# Patient Record
Sex: Male | Born: 1985 | Race: Black or African American | Hispanic: No | Marital: Single | Smoking: Never smoker
Health system: Southern US, Community
[De-identification: ages and names within clinical notes are randomized; demographics above are authoritative.]

---

## 2013-10-04 HISTORY — PX: VASECTOMY: SHX75

## 2020-10-30 DIAGNOSIS — U071 COVID-19: Secondary | ICD-10-CM | POA: Diagnosis not present

## 2020-10-30 DIAGNOSIS — B342 Coronavirus infection, unspecified: Secondary | ICD-10-CM

## 2020-10-30 HISTORY — DX: Coronavirus infection, unspecified: B34.2

## 2020-11-02 ENCOUNTER — Emergency Department (HOSPITAL_COMMUNITY)
Admission: EM | Admit: 2020-11-02 | Discharge: 2020-11-02 | Disposition: A | Discharge status: Home / Self Care | Payer: BC Managed Care – PPO | Attending: Emergency Medicine | Admitting: Emergency Medicine

## 2020-11-02 ENCOUNTER — Encounter (HOSPITAL_COMMUNITY): Payer: Self-pay | Admitting: Emergency Medicine

## 2020-11-02 ENCOUNTER — Other Ambulatory Visit: Payer: Self-pay

## 2020-11-02 ENCOUNTER — Emergency Department (HOSPITAL_COMMUNITY): Payer: BC Managed Care – PPO

## 2020-11-02 DIAGNOSIS — R509 Fever, unspecified: Secondary | ICD-10-CM | POA: Diagnosis not present

## 2020-11-02 DIAGNOSIS — R0602 Shortness of breath: Secondary | ICD-10-CM | POA: Insufficient documentation

## 2020-11-02 DIAGNOSIS — R531 Weakness: Secondary | ICD-10-CM | POA: Diagnosis not present

## 2020-11-02 DIAGNOSIS — R059 Cough, unspecified: Secondary | ICD-10-CM | POA: Insufficient documentation

## 2020-11-02 DIAGNOSIS — Z5321 Procedure and treatment not carried out due to patient leaving prior to being seen by health care provider: Secondary | ICD-10-CM | POA: Diagnosis not present

## 2020-11-02 DIAGNOSIS — U071 COVID-19: Secondary | ICD-10-CM | POA: Insufficient documentation

## 2020-11-02 DIAGNOSIS — M791 Myalgia, unspecified site: Secondary | ICD-10-CM | POA: Diagnosis not present

## 2020-11-02 LAB — BASIC METABOLIC PANEL
Anion gap: 13 (ref 5–15)
BUN: 10 mg/dL (ref 6–20)
CO2: 23 mmol/L (ref 22–32)
Calcium: 9 mg/dL (ref 8.9–10.3)
Chloride: 97 mmol/L — ABNORMAL LOW (ref 98–111)
Creatinine, Ser: 1.06 mg/dL (ref 0.61–1.24)
GFR, Estimated: >60 mL/min (ref >60)
Glucose, Bld: 139 mg/dL — ABNORMAL HIGH (ref 70–99)
Potassium: 4.1 mmol/L (ref 3.5–5.1)
Sodium: 133 mmol/L — ABNORMAL LOW (ref 135–145)

## 2020-11-02 LAB — CBC
HCT: 49.5 % (ref 39.0–52.0)
Hemoglobin: 15.5 g/dL (ref 13.0–17.0)
MCH: 27.6 pg (ref 26.0–34.0)
MCHC: 31.3 g/dL (ref 30.0–36.0)
MCV: 88.1 fL (ref 80.0–100.0)
Platelets: 233 10*3/uL (ref 150–400)
RBC: 5.62 MIL/uL (ref 4.22–5.81)
RDW: 13.9 % (ref 11.5–15.5)
WBC: 5.9 10*3/uL (ref 4.0–10.5)
nRBC: 0 % (ref 0.0–0.2)

## 2020-11-02 NOTE — ED Triage Notes (Signed)
COVID + 1/27.  C/o fever, chills, cough, body aches, generalized weakness, and SOB.

## 2020-11-02 NOTE — ED Notes (Signed)
Pt called for vitals x2 no response.  °

## 2020-11-02 NOTE — ED Notes (Signed)
Patient called x3 with no response °

## 2020-11-07 ENCOUNTER — Encounter (HOSPITAL_COMMUNITY): Payer: Self-pay

## 2020-11-07 ENCOUNTER — Inpatient Hospital Stay (HOSPITAL_COMMUNITY)
Admission: EM | Admit: 2020-11-07 | Discharge: 2020-11-11 | DRG: 871 | Disposition: A | Discharge status: Home / Self Care | Payer: BC Managed Care – PPO | Attending: Internal Medicine | Admitting: Internal Medicine

## 2020-11-07 ENCOUNTER — Emergency Department (HOSPITAL_COMMUNITY): Payer: BC Managed Care – PPO

## 2020-11-07 ENCOUNTER — Inpatient Hospital Stay (HOSPITAL_COMMUNITY): Payer: BC Managed Care – PPO

## 2020-11-07 DIAGNOSIS — R000 Tachycardia, unspecified: Secondary | ICD-10-CM | POA: Diagnosis not present

## 2020-11-07 DIAGNOSIS — J189 Pneumonia, unspecified organism: Secondary | ICD-10-CM | POA: Diagnosis not present

## 2020-11-07 DIAGNOSIS — E876 Hypokalemia: Secondary | ICD-10-CM | POA: Diagnosis not present

## 2020-11-07 DIAGNOSIS — R0602 Shortness of breath: Secondary | ICD-10-CM | POA: Diagnosis not present

## 2020-11-07 DIAGNOSIS — Z880 Allergy status to penicillin: Secondary | ICD-10-CM | POA: Diagnosis not present

## 2020-11-07 DIAGNOSIS — R918 Other nonspecific abnormal finding of lung field: Secondary | ICD-10-CM | POA: Diagnosis not present

## 2020-11-07 DIAGNOSIS — R0902 Hypoxemia: Secondary | ICD-10-CM | POA: Diagnosis not present

## 2020-11-07 DIAGNOSIS — J1282 Pneumonia due to coronavirus disease 2019: Secondary | ICD-10-CM | POA: Diagnosis not present

## 2020-11-07 DIAGNOSIS — J9601 Acute respiratory failure with hypoxia: Secondary | ICD-10-CM | POA: Diagnosis present

## 2020-11-07 DIAGNOSIS — K760 Fatty (change of) liver, not elsewhere classified: Secondary | ICD-10-CM | POA: Diagnosis not present

## 2020-11-07 DIAGNOSIS — A4189 Other specified sepsis: Secondary | ICD-10-CM | POA: Diagnosis not present

## 2020-11-07 DIAGNOSIS — U071 COVID-19: Secondary | ICD-10-CM | POA: Diagnosis not present

## 2020-11-07 DIAGNOSIS — R7989 Other specified abnormal findings of blood chemistry: Secondary | ICD-10-CM | POA: Diagnosis not present

## 2020-11-07 DIAGNOSIS — E871 Hypo-osmolality and hyponatremia: Secondary | ICD-10-CM | POA: Diagnosis not present

## 2020-11-07 DIAGNOSIS — Z6841 Body Mass Index (BMI) 40.0 and over, adult: Secondary | ICD-10-CM

## 2020-11-07 DIAGNOSIS — I10 Essential (primary) hypertension: Secondary | ICD-10-CM | POA: Diagnosis not present

## 2020-11-07 LAB — COMPREHENSIVE METABOLIC PANEL
ALT: 57 U/L — ABNORMAL HIGH (ref 0–44)
AST: 71 U/L — ABNORMAL HIGH (ref 15–41)
Albumin: 4.3 g/dL (ref 3.5–5.0)
Alkaline Phosphatase: 36 U/L — ABNORMAL LOW (ref 38–126)
Anion gap: 15 (ref 5–15)
BUN: 17 mg/dL (ref 6–20)
CO2: 22 mmol/L (ref 22–32)
Calcium: 9.3 mg/dL (ref 8.9–10.3)
Chloride: 95 mmol/L — ABNORMAL LOW (ref 98–111)
Creatinine, Ser: 1.18 mg/dL (ref 0.61–1.24)
GFR, Estimated: >60 mL/min (ref >60)
Glucose, Bld: 123 mg/dL — ABNORMAL HIGH (ref 70–99)
Potassium: 4.4 mmol/L (ref 3.5–5.1)
Sodium: 132 mmol/L — ABNORMAL LOW (ref 135–145)
Total Bilirubin: 1.2 mg/dL (ref 0.3–1.2)
Total Protein: 9.2 g/dL — ABNORMAL HIGH (ref 6.5–8.1)

## 2020-11-07 LAB — CBC WITH DIFFERENTIAL/PLATELET
Abs Immature Granulocytes: 0.02 10*3/uL (ref 0.00–0.07)
Basophils Absolute: 0 10*3/uL (ref 0.0–0.1)
Basophils Relative: 0 %
Eosinophils Absolute: 0 10*3/uL (ref 0.0–0.5)
Eosinophils Relative: 0 %
HCT: 45.7 % (ref 39.0–52.0)
Hemoglobin: 14.9 g/dL (ref 13.0–17.0)
Immature Granulocytes: 0 %
Lymphocytes Relative: 18 %
Lymphs Abs: 1.3 10*3/uL (ref 0.7–4.0)
MCH: 28.2 pg (ref 26.0–34.0)
MCHC: 32.6 g/dL (ref 30.0–36.0)
MCV: 86.4 fL (ref 80.0–100.0)
Monocytes Absolute: 0.4 10*3/uL (ref 0.1–1.0)
Monocytes Relative: 5 %
Neutro Abs: 5.7 10*3/uL (ref 1.7–7.7)
Neutrophils Relative %: 77 %
Platelets: 360 10*3/uL (ref 150–400)
RBC: 5.29 MIL/uL (ref 4.22–5.81)
RDW: 14 % (ref 11.5–15.5)
WBC: 7.4 10*3/uL (ref 4.0–10.5)
nRBC: 0 % (ref 0.0–0.2)

## 2020-11-07 LAB — HEPATITIS PANEL, ACUTE
HCV Ab: NONREACTIVE
Hep A IgM: NONREACTIVE
Hep B C IgM: NONREACTIVE
Hepatitis B Surface Ag: NONREACTIVE

## 2020-11-07 LAB — D-DIMER, QUANTITATIVE: D-Dimer, Quant: 1.98 ug/mL-FEU — ABNORMAL HIGH (ref 0.00–0.50)

## 2020-11-07 LAB — TRIGLYCERIDES: Triglycerides: 136 mg/dL (ref <150)

## 2020-11-07 LAB — LACTIC ACID, PLASMA
Lactic Acid, Venous: 1.2 mmol/L (ref 0.5–1.9)
Lactic Acid, Venous: 1.1 mmol/L (ref 0.5–1.9)

## 2020-11-07 LAB — C-REACTIVE PROTEIN: CRP: 18.2 mg/dL — ABNORMAL HIGH (ref <1.0)

## 2020-11-07 LAB — FERRITIN: Ferritin: 583 ng/mL — ABNORMAL HIGH (ref 24–336)

## 2020-11-07 LAB — FIBRINOGEN: Fibrinogen: >800 mg/dL — ABNORMAL HIGH (ref 210–475)

## 2020-11-07 LAB — PROCALCITONIN: Procalcitonin: <0.1 ng/mL

## 2020-11-07 LAB — LACTATE DEHYDROGENASE: LDH: 413 U/L — ABNORMAL HIGH (ref 98–192)

## 2020-11-07 MED ORDER — METHYLPREDNISOLONE SODIUM SUCC 125 MG IJ SOLR
125.0000 mg | Freq: Once | INTRAMUSCULAR | Status: AC
  Administered 2020-11-07: 125 mg via INTRAVENOUS
  Filled 2020-11-07: qty 2

## 2020-11-07 MED ORDER — GUAIFENESIN-DM 100-10 MG/5ML PO SYRP
10.0000 mL | ORAL_SOLUTION | ORAL | Status: DC | PRN
  Administered 2020-11-10: 10 mL via ORAL
  Filled 2020-11-07: qty 10

## 2020-11-07 MED ORDER — ASCORBIC ACID 500 MG PO TABS
500.0000 mg | ORAL_TABLET | Freq: Every day | ORAL | Status: DC
  Administered 2020-11-08 – 2020-11-11 (×4): 500 mg via ORAL
  Filled 2020-11-07 (×4): qty 1

## 2020-11-07 MED ORDER — BARICITINIB 2 MG PO TABS
4.0000 mg | ORAL_TABLET | Freq: Every day | ORAL | Status: DC
  Administered 2020-11-08 – 2020-11-11 (×4): 4 mg via ORAL
  Filled 2020-11-07 (×4): qty 2

## 2020-11-07 MED ORDER — SODIUM CHLORIDE 0.9 % IV SOLN: INTRAVENOUS | Status: DC

## 2020-11-07 MED ORDER — SODIUM CHLORIDE 0.9 % IV SOLN
1.0000 mg/kg | Freq: Two times a day (BID) | INTRAVENOUS | Status: DC
  Administered 2020-11-08 – 2020-11-09 (×4): 145 mg via INTRAVENOUS
  Filled 2020-11-07: qty 1.2
  Filled 2020-11-07 (×2): qty 2.32
  Filled 2020-11-07: qty 1.2
  Filled 2020-11-07: qty 1.16
  Filled 2020-11-07 (×2): qty 2.32

## 2020-11-07 MED ORDER — SODIUM CHLORIDE 0.9 % IV SOLN
200.0000 mg | Freq: Once | INTRAVENOUS | Status: AC
  Administered 2020-11-07: 200 mg via INTRAVENOUS
  Filled 2020-11-07: qty 200

## 2020-11-07 MED ORDER — ACETAMINOPHEN 325 MG PO TABS
650.0000 mg | ORAL_TABLET | Freq: Once | ORAL | Status: DC | PRN
  Filled 2020-11-07: qty 2

## 2020-11-07 MED ORDER — ENOXAPARIN SODIUM 80 MG/0.8ML ~~LOC~~ SOLN
70.0000 mg | SUBCUTANEOUS | Status: DC
  Administered 2020-11-08 – 2020-11-10 (×3): 70 mg via SUBCUTANEOUS
  Filled 2020-11-07 (×3): qty 0.8

## 2020-11-07 MED ORDER — IOHEXOL 350 MG/ML SOLN
100.0000 mL | Freq: Once | INTRAVENOUS | Status: AC | PRN
  Administered 2020-11-07: 100 mL via INTRAVENOUS

## 2020-11-07 MED ORDER — HYDROCOD POLST-CPM POLST ER 10-8 MG/5ML PO SUER: 5.0000 mL | Freq: Two times a day (BID) | ORAL | Status: DC | PRN

## 2020-11-07 MED ORDER — SODIUM CHLORIDE 0.9 % IV SOLN
100.0000 mg | Freq: Every day | INTRAVENOUS | Status: AC
  Administered 2020-11-08 – 2020-11-11 (×4): 100 mg via INTRAVENOUS
  Filled 2020-11-07 (×4): qty 20

## 2020-11-07 MED ORDER — IPRATROPIUM-ALBUTEROL 20-100 MCG/ACT IN AERS
1.0000 | INHALATION_SPRAY | Freq: Four times a day (QID) | RESPIRATORY_TRACT | Status: DC
  Administered 2020-11-08 – 2020-11-10 (×12): 1 via RESPIRATORY_TRACT
  Filled 2020-11-07: qty 4

## 2020-11-07 MED ORDER — ACETAMINOPHEN 325 MG PO TABS: 650.0000 mg | ORAL_TABLET | Freq: Four times a day (QID) | ORAL | Status: DC | PRN

## 2020-11-07 MED ORDER — PREDNISONE 20 MG PO TABS: 50.0000 mg | ORAL_TABLET | Freq: Every day | ORAL | Status: DC

## 2020-11-07 MED ORDER — ZINC SULFATE 220 (50 ZN) MG PO CAPS
220.0000 mg | ORAL_CAPSULE | Freq: Every day | ORAL | Status: DC
  Administered 2020-11-08 – 2020-11-11 (×4): 220 mg via ORAL
  Filled 2020-11-07 (×4): qty 1

## 2020-11-07 NOTE — ED Notes (Signed)
Tried to call report to [employer], she is unavailable at this time, states that she will call back when she is  available.

## 2020-11-07 NOTE — ED Provider Notes (Signed)
[facility]-EMERGENCY DEPT Provider Note   CSN: [id number] Arrival date & time: 11/07/20  1023     History Chief Complaint  Patient presents with  . Covid Positive  . Fever    Kevin Brock is a 35 y.o. male.  He tested positive for Covid on January 27.  Has felt sicker since then.  Cough, fever, chills, headache, body aches, vomiting, diarrhea, shortness of breath.  Poor p.o. intake.  Has not been vaccinated.  Has received no other therapy for this other than isolation.  Non-smoker.  Oxygen saturation was 84% on room air on arrival to triage.  Placed on 4 L nasal cannula  The history is provided by the patient.  Fever Max temp prior to arrival:  103 Temp source:  Oral Severity:  Moderate Timing:  Intermittent Progression:  Unchanged Chronicity:  New Relieved by:  None tried Worsened by:  Nothing Ineffective treatments:  Acetaminophen Associated symptoms: chills, congestion, cough, diarrhea, headaches, myalgias, nausea, sore throat and vomiting   Associated symptoms: no chest pain, no dysuria and no rash   Shortness of Breath Severity:  Moderate Onset quality:  Gradual Timing:  Intermittent Progression:  Worsening Chronicity:  New Relieved by:  Nothing Worsened by:  Activity and coughing Ineffective treatments:  Rest Associated symptoms: cough, fever, headaches, sore throat and vomiting   Associated symptoms: no chest pain and no rash        History reviewed. No pertinent past medical history.  There are no problems to display for this patient.   History reviewed. No pertinent surgical history.     History reviewed. No pertinent family history.  Social History   Tobacco Use  . Smoking status: Never Smoker  . Smokeless tobacco: Never Used  Substance Use Topics  . Alcohol use: Not Currently  . Drug use: Not Currently    Home Medications Prior to Admission medications   Not on File    Allergies    Penicillins  Review of  Systems   Review of Systems  Constitutional: Positive for appetite change, chills, fatigue and fever.  HENT: Positive for congestion and sore throat.   Eyes: Negative for visual disturbance.  Respiratory: Positive for cough and shortness of breath.   Cardiovascular: Negative for chest pain.  Gastrointestinal: Positive for diarrhea, nausea and vomiting.  Genitourinary: Negative for dysuria.  Musculoskeletal: Positive for myalgias.  Skin: Negative for rash.  Neurological: Positive for headaches.    Physical Exam Updated Vital Signs BP (!) 142/91 (BP Location: Left Arm)   Pulse (!) 120   Temp (!) 103 F (39.4 C) (Oral)   Resp 20   SpO2 95%   Physical Exam Vitals and nursing note reviewed.  Constitutional:      Appearance: Normal appearance. He is well-developed and well-nourished. He is ill-appearing.  HENT:     Head: Normocephalic and atraumatic.     Mouth/Throat:     Mouth: Mucous membranes are moist.     Pharynx: Oropharynx is clear.  Eyes:     Conjunctiva/sclera: Conjunctivae normal.  Cardiovascular:     Rate and Rhythm: Regular rhythm. Tachycardia present.     Heart sounds: No murmur heard.   Pulmonary:     Effort: Accessory muscle usage present. No respiratory distress.     Breath sounds: Rhonchi (scattered) present.  Abdominal:     Palpations: Abdomen is soft.     Tenderness: There is no abdominal tenderness. There is no guarding or rebound.  Musculoskeletal:  General: No swelling, deformity, signs of injury or edema. Normal range of motion.     Cervical back: Neck supple.  Skin:    General: Skin is warm and dry.     Capillary Refill: Capillary refill takes less than 2 seconds.  Neurological:     General: No focal deficit present.     Mental Status: He is alert.  Psychiatric:        Mood and Affect: Mood and affect normal.     ED Results / Procedures / Treatments   Labs (all labs ordered are listed, but only abnormal results are displayed) Labs  Reviewed  D-DIMER, QUANTITATIVE (NOT AT [facility]) - Abnormal; Notable for the following components:      Result Value   D-Dimer, Quant 1.98 (*)    All other components within normal limits  FERRITIN - Abnormal; Notable for the following components:   Ferritin 583 (*)    All other components within normal limits  FIBRINOGEN - Abnormal; Notable for the following components:   Fibrinogen >800 (*)    All other components within normal limits  C-REACTIVE PROTEIN - Abnormal; Notable for the following components:   CRP 18.2 (*)    All other components within normal limits  COMPREHENSIVE METABOLIC PANEL - Abnormal; Notable for the following components:   Sodium 132 (*)    Chloride 95 (*)    Glucose, Bld 123 (*)    Total Protein 9.2 (*)    AST 71 (*)    ALT 57 (*)    Alkaline Phosphatase 36 (*)    All other components within normal limits  LACTATE DEHYDROGENASE - Abnormal; Notable for the following components:   LDH 413 (*)    All other components within normal limits  C-REACTIVE PROTEIN - Abnormal; Notable for the following components:   CRP 16.0 (*)    All other components within normal limits  D-DIMER, QUANTITATIVE (NOT AT [facility]) - Abnormal; Notable for the following components:   D-Dimer, Quant 1.77 (*)    All other components within normal limits  FERRITIN - Abnormal; Notable for the following components:   Ferritin 600 (*)    All other components within normal limits  MAGNESIUM - Abnormal; Notable for the following components:   Magnesium 2.8 (*)    All other components within normal limits  CULTURE, BLOOD (ROUTINE X 2)  CULTURE, BLOOD (ROUTINE X 2)  LACTIC ACID, PLASMA  LACTIC ACID, PLASMA  CBC WITH DIFFERENTIAL/PLATELET  TRIGLYCERIDES  PROCALCITONIN  HEPATITIS PANEL, ACUTE  CBC WITH DIFFERENTIAL/PLATELET  PHOSPHORUS  HIV ANTIBODY (ROUTINE TESTING W REFLEX)    EKG EKG Interpretation  Date/Time:  Friday November 07 2020 13:11:52 EST Ventricular Rate:  111 PR  Interval:    QRS Duration: 105 QT Interval:  317 QTC Calculation: 431 R Axis:   36 Text Interpretation: Sinus tachycardia Borderline T abnormalities, inferior leads No significant change since prior 1/22 Confirmed by Meridee Score [id number]) on 11/07/2020 1:40:48 PM   Radiology DG Chest Port 1 View  Result Date: 11/07/2020 CLINICAL DATA:  Clip EXAM: PORTABLE CHEST 1 VIEW COMPARISON:  November 02, 2020 FINDINGS: The cardiomediastinal silhouette is unchanged in contour.Low lung volumes. No pleural effusion. No pneumothorax. Increased multifocal bilateral peripheral predominant heterogeneous opacities, consistent with the sequela of COVID-19 infection. Visualized abdomen is unremarkable. No acute osseous abnormality. IMPRESSION: Increased multifocal bilateral peripheral predominant heterogeneous opacities, consistent with the sequela of COVID-19 infection. Electronically Signed   By: Meda Klinefelter MD   On: 11/07/2020 11:37  Procedures .Critical Care Performed by: Terrilee Files, MD Authorized by: Terrilee Files, MD   Critical care provider statement:    Critical care time (minutes):  45   Critical care time was exclusive of:  Separately billable procedures and treating other patients   Critical care was necessary to treat or prevent imminent or life-threatening deterioration of the following conditions:  Respiratory failure   Critical care was time spent personally by me on the following activities:  Discussions with consultants, evaluation of patient's response to treatment, examination of patient, ordering and performing treatments and interventions, ordering and review of laboratory studies, ordering and review of radiographic studies, pulse oximetry, re-evaluation of patient's condition, obtaining history from patient or surrogate and review of old charts     Medications Ordered in ED Medications  acetaminophen (TYLENOL) tablet 650 mg (has no administration in time range)   remdesivir 200 mg in sodium chloride 0.9% 250 mL IVPB (has no administration in time range)  remdesivir 100 mg in sodium chloride 0.9 % 100 mL IVPB (has no administration in time range)  methylPREDNISolone sodium succinate (SOLU-MEDROL) 125 mg/2 mL injection 125 mg (125 mg Intravenous Given 11/07/20 1235)    ED Course  I have reviewed the triage vital signs and the nursing notes.  Pertinent labs & imaging results that were available during my care of the patient were reviewed by me and considered in my medical decision making (see chart for details).  Clinical Course as of 11/07/20 1357  Fri Nov 07, 2020  1317 Discussed with [facility] hospitalist Dr. Ronaldo Miyamoto who will evaluate the patient for admission. [MB]    Clinical Course User Index [MB] Terrilee Files, MD   MDM Rules/Calculators/A&P                         Kevin Brock was evaluated in Emergency Department on 11/07/2020 for the symptoms described in the history of present illness. He was evaluated in the context of the global COVID-19 pandemic, which necessitated consideration that the patient might be at risk for infection with the SARS-CoV-2 virus that causes COVID-19. Institutional protocols and algorithms that pertain to the evaluation of patients at risk for COVID-19 are in a state of rapid change based on information released by regulatory bodies including the CDC and federal and state organizations. These policies and algorithms were followed during the patient's care in the ED.  This patient complains of worsening Covid symptoms including fever shortness of breath; this involves an extensive number of treatment Options and is a complaint that carries with it a high risk of complications and Morbidity. The differential includes COVID, COVID pneumonia, sepsis, Sirs, bacterial coinfection  I ordered, reviewed and interpreted labs, which included CBC with normal white count normal hemoglobin, chemistries and LFTs fairly normal other  than some mild bump in AST and ALT likely due to Covid I ordered medication IV fluids and steroids, IV remdesivir I ordered imaging studies which included chest x-ray and I independently    visualized and interpreted imaging which showed multifocal pneumonia Previous records obtained and reviewed in epic including recent ED visit.  Patient had a positive Covid test from [facility] that is in Care Everywhere I consulted Dr. Ronaldo Miyamoto [facility] hospitalist and discussed lab and imaging findings  Critical Interventions: Work-up and management of patient's acute hypoxia and COVID pneumonia  After the interventions stated above, I reevaluated the patient and found patient to be hemodynamically stable although still tachypneic with  increased work of breathing.  Requiring oxygen.  Will need admission to the hospital for further stabilization.  Patient in agreement with plan.   Final Clinical Impression(s) / ED Diagnoses Final diagnoses:  Acute hypoxemic respiratory failure due to COVID-19 [facility])  Multifocal pneumonia  Hypokalemia    Rx / DC Orders ED Discharge Orders    None       Terrilee Files, MD 11/08/20 1131

## 2020-11-07 NOTE — ED Notes (Signed)
PT GIVEN APPLE JUICE AND APPLESAUCE.

## 2020-11-07 NOTE — ED Triage Notes (Addendum)
Pt presents with c/o Covid positive on 1/27. Pt reports cough and body aches. Pt is on 4L of O2 via EMS, was 84 % on RA upon their arrival.

## 2020-11-07 NOTE — H&P (Signed)
History and Physical    Kevin Brock LOV:[id number] DOB: 1986-09-26 DOA: 11/07/2020  PCP: Patient, No Pcp Per  Patient coming from: Home  Chief Complaint: dyspnea  HPI: Kevin Brock is a 35 y.o. male with noe significant medical history. Presenting with fevers, chills and dyspnea for past 2 weeks. He initially tried APAP, robitussin, and nyquil to help. However he received no relief. After dealing with his symptoms for several days, he went to urgent care. He was told that he was positive for COVID and that he should self isolate. So he went home to quarantine. However, he symptoms only worsen. He decided that he would come to the ED. He denies any other alleviating or aggravating factors. He denies any other treatments.   ED Course: He was found to be hypoxic. Started on remdes and steroids. [facility] was called for admission.   Review of Systems:  Denies CP, palpitations, N/V/D. Reports subjective fevers. Reports chills. Denies lightheadedness, dizziness, syncopal episodes. He is unvaccinated.  Review of systems is otherwise negative for all not mentioned in HPI.   PMHx History reviewed. No pertinent past medical history.  PSHx History reviewed. No pertinent surgical history.  SocHx  reports that he has never smoked. He has never used smokeless tobacco. He reports previous alcohol use. He reports previous drug use.  Allergies  Allergen Reactions  . Penicillins     FamHx History reviewed. No pertinent family history.  Prior to Admission medications   Not on File    Physical Exam: Vitals:   11/07/20 1031 11/07/20 1245  BP: (!) 142/91 (!) 141/89  Pulse: (!) 120 (!) 110  Resp: 20 (!) 22  Temp: (!) 103 F (39.4 C)   TempSrc: Oral   SpO2: 95% 99%    General: 35 y.o. male resting in bed in NAD Eyes: PERRL, normal sclera ENMT: Nares patent w/o discharge, orophaynx clear, dentition normal, ears w/o discharge/lesions/ulcers Neck: Supple, trachea midline Cardiovascular:  tachy, +S1, S2, no m/g/r, equal pulses throughout Respiratory: tachypneic, scattered rhonchi, increased WOB on 4L [place] GI: BS+, NDNT, no masses noted, no organomegaly noted MSK: No e/c/c Skin: No rashes, bruises, ulcerations noted Neuro: A&O x 3, no focal deficits Psyc: Appropriate interaction and affect, calm/cooperative  Labs on Admission: I have personally reviewed following labs and imaging studies  CBC: Recent Labs  Lab 11/02/20 1614 11/07/20 1245  WBC 5.9 7.4  NEUTROABS  --  5.7  HGB 15.5 14.9  HCT 49.5 45.7  MCV 88.1 86.4  PLT 233 360   Basic Metabolic Panel: Recent Labs  Lab 11/02/20 1614 11/07/20 1213  NA 133* 132*  K 4.1 4.4  CL 97* 95*  CO2 23 22  GLUCOSE 139* 123*  BUN 10 17  CREATININE 1.06 1.18  CALCIUM 9.0 9.3   GFR: CrCl cannot be calculated (Unknown ideal weight.). Liver Function Tests: Recent Labs  Lab 11/07/20 1213  AST 71*  ALT 57*  ALKPHOS 36*  BILITOT 1.2  PROT 9.2*  ALBUMIN 4.3   No results for input(s): LIPASE, AMYLASE in the last 168 hours. No results for input(s): AMMONIA in the last 168 hours. Coagulation Profile: No results for input(s): INR, PROTIME in the last 168 hours. Cardiac Enzymes: No results for input(s): CKTOTAL, CKMB, CKMBINDEX, TROPONINI in the last 168 hours. BNP (last 3 results) No results for input(s): PROBNP in the last 8760 hours. HbA1C: No results for input(s): HGBA1C in the last 72 hours. CBG: No results for input(s): GLUCAP in the last 168 hours.  Lipid Profile: No results for input(s): CHOL, HDL, LDLCALC, TRIG, CHOLHDL, LDLDIRECT in the last 72 hours. Thyroid Function Tests: No results for input(s): TSH, T4TOTAL, FREET4, T3FREE, THYROIDAB in the last 72 hours. Anemia Panel: No results for input(s): VITAMINB12, FOLATE, FERRITIN, TIBC, IRON, RETICCTPCT in the last 72 hours. Urine analysis: No results found for: COLORURINE, APPEARANCEUR, LABSPEC, PHURINE, GLUCOSEU, HGBUR, BILIRUBINUR, KETONESUR, PROTEINUR,  UROBILINOGEN, NITRITE, LEUKOCYTESUR  Radiological Exams on Admission: DG Chest Port 1 View  Result Date: 11/07/2020 CLINICAL DATA:  Clip EXAM: PORTABLE CHEST 1 VIEW COMPARISON:  November 02, 2020 FINDINGS: The cardiomediastinal silhouette is unchanged in contour.Low lung volumes. No pleural effusion. No pneumothorax. Increased multifocal bilateral peripheral predominant heterogeneous opacities, consistent with the sequela of COVID-19 infection. Visualized abdomen is unremarkable. No acute osseous abnormality. IMPRESSION: Increased multifocal bilateral peripheral predominant heterogeneous opacities, consistent with the sequela of COVID-19 infection. Electronically Signed   By: Meda Klinefelter MD   On: 11/07/2020 11:37    EKG: Independently reviewed. Sinus tach, no st elevations  Assessment/Plan COVID 19 PNA Acute hypoxic respiratory failure secondary to above Sepsis secondary to above     - admit to inpt, tele     - requiring 4L [place] to maintain sats; he remains tachypneic and somewhat breathless with speech at this level of support     - continue remdes, steroids; add bariticinib (procal is negative), inhalers, anti-tussives, IS, FV     - d-dimer is elevated, check CTA PE     - follow inflamatory markers     - give fluids     - sepsis: tachycardia, tachypnea, source: covid, elevated LFTs and respiratory failure  Elevated LFTs     - likely secondary to sepsis physiology     - check hepatitis panel; ab [place]  Hyponatremia     - fluids, follow  DVT prophylaxis: lovenox  Code Status: FULL  Family Communication: None at bedside  Consults called: None  Status is: Inpatient  Remains inpatient appropriate because:Inpatient level of care appropriate due to severity of illness   Dispo: The patient is from: Home              Anticipated d/c is to: Home              Anticipated d/c date is: 3 days              Patient currently is not medically stable to d/c.   Difficult to place patient  No  Teddy Spike DO [facility]  If 7PM-7AM, please contact night-coverage [email or web address]  11/07/2020, 1:18 PM

## 2020-11-08 ENCOUNTER — Other Ambulatory Visit: Payer: Self-pay

## 2020-11-08 DIAGNOSIS — E876 Hypokalemia: Secondary | ICD-10-CM | POA: Diagnosis not present

## 2020-11-08 DIAGNOSIS — U071 COVID-19: Secondary | ICD-10-CM | POA: Diagnosis not present

## 2020-11-08 DIAGNOSIS — J189 Pneumonia, unspecified organism: Secondary | ICD-10-CM | POA: Diagnosis not present

## 2020-11-08 DIAGNOSIS — J9601 Acute respiratory failure with hypoxia: Secondary | ICD-10-CM

## 2020-11-08 DIAGNOSIS — R7989 Other specified abnormal findings of blood chemistry: Secondary | ICD-10-CM | POA: Diagnosis not present

## 2020-11-08 LAB — CBC WITH DIFFERENTIAL/PLATELET
Abs Immature Granulocytes: 0.03 10*3/uL (ref 0.00–0.07)
Basophils Absolute: 0 10*3/uL (ref 0.0–0.1)
Basophils Relative: 0 %
Eosinophils Absolute: 0 10*3/uL (ref 0.0–0.5)
Eosinophils Relative: 0 %
HCT: 42.8 % (ref 39.0–52.0)
Hemoglobin: 13.8 g/dL (ref 13.0–17.0)
Immature Granulocytes: 1 %
Lymphocytes Relative: 14 %
Lymphs Abs: 0.8 10*3/uL (ref 0.7–4.0)
MCH: 28.2 pg (ref 26.0–34.0)
MCHC: 32.2 g/dL (ref 30.0–36.0)
MCV: 87.5 fL (ref 80.0–100.0)
Monocytes Absolute: 0.2 10*3/uL (ref 0.1–1.0)
Monocytes Relative: 3 %
Neutro Abs: 4.8 10*3/uL (ref 1.7–7.7)
Neutrophils Relative %: 82 %
Platelets: 377 10*3/uL (ref 150–400)
RBC: 4.89 MIL/uL (ref 4.22–5.81)
RDW: 13.8 % (ref 11.5–15.5)
WBC: 5.9 10*3/uL (ref 4.0–10.5)
nRBC: 0 % (ref 0.0–0.2)

## 2020-11-08 LAB — C-REACTIVE PROTEIN: CRP: 16 mg/dL — ABNORMAL HIGH (ref <1.0)

## 2020-11-08 LAB — MAGNESIUM: Magnesium: 2.8 mg/dL — ABNORMAL HIGH (ref 1.7–2.4)

## 2020-11-08 LAB — FERRITIN: Ferritin: 600 ng/mL — ABNORMAL HIGH (ref 24–336)

## 2020-11-08 LAB — HIV ANTIBODY (ROUTINE TESTING W REFLEX): HIV Screen 4th Generation wRfx: NONREACTIVE

## 2020-11-08 LAB — D-DIMER, QUANTITATIVE: D-Dimer, Quant: 1.77 ug/mL-FEU — ABNORMAL HIGH (ref 0.00–0.50)

## 2020-11-08 LAB — PHOSPHORUS: Phosphorus: 4.1 mg/dL (ref 2.5–4.6)

## 2020-11-08 MED ORDER — HYDROCOD POLST-CPM POLST ER 10-8 MG/5ML PO SUER
5.0000 mL | Freq: Two times a day (BID) | ORAL | Status: DC
  Administered 2020-11-08 – 2020-11-11 (×7): 5 mL via ORAL
  Filled 2020-11-08 (×7): qty 5

## 2020-11-08 NOTE — Progress Notes (Signed)
PROGRESS NOTE    Patient: Kevin Brock                            PCP: Patient, No Pcp Per                    DOB: 10/20/1985            DOA: 11/07/2020 [id number]             DOS: 11/08/2020, 9:54 AM   LOS: 1 day   Date of Service: The patient was seen and examined on 11/08/2020  Subjective:   The patient was seen and examined this morning. Stable, but O2 demand has increased to 7 L, satting 96% Complaining of SOB, Cough  .  Brief Narrative:   Kevin Brock is a 35 y.o. male with noe significant medical history. Presenting with fevers, chills and dyspnea for past 2 weeks. He initially tried APAP, robitussin, and nyquil to help. However he received no relief. After dealing with his symptoms for several days, he went to urgent care. He was told that he was positive for COVID and that he should self isolate. So he went home to quarantine. However, he symptoms only worsen. He decided that he would come to the ED. He denies any other alleviating or aggravating factors. He denies any other treatments.   ED Course: He was found to be hypoxic. Started on remdes and steroids.    Assessment & Plan:   Active Problems:   COVID-19  COVID 19 PNA Acute hypoxic respiratory failure secondary Covid infection -Sepsis -source of infection SARS-CoV-2-met the criteria on admission with T-max of 103, pulse of 120, respiratory rate as high as 40, hypoxic on room air -Initially patient required 2 L then 4 L currently on 7 L of oxygen to maintain O2 sat of 96% - Tachypnea, tachycardia improving -lactic acid 1.1, procalcitonin <0.10 - Continue IV remdesivir, steroids, Bariticinib (procal is negative), inhalers, anti-tussives, IS, FV - d-dimer is elevated, -  CTA PE >> no evidence of PE, multifocal pneumonia -Trending and following: Inflammatory markers -Initiating gentle IV fluid hydration will discontinue today     - sepsis: tachycardia, tachypnea, source: covid, elevated LFTs and respiratory  failure  Elevated LFTs     - likely secondary to sepsis physiology, Covid infection     -We will monitor closely         -Hepatitis panel nonreactive-  ab [place]  Hyponatremia     - fluids, following improved    ------------------------------------------------------------------------------------------------------------------------------------ Nutritional status:  The patient's BMI is: Body mass index is 40 kg/m. I agree with the assessment and plan as outlined below:  ------------------------------------------------------------------------------------------------------------------------------------ Cultures; Blood cultures x2  Antimicrobials: None   Consultants: None ------------------------------------------------------------------------------------------------------------------------------------  DVT prophylaxis:  Lovenox SQ Code Status:   Code Status: Full Code  Family Communication: No family member present at bedside-daily The above findings and plan of care has been discussed with patient  in detail,  they expressed understanding and agreement of above.    Admission status:   Status is: Inpatient  Remains inpatient appropriate because:Inpatient level of care appropriate due to severity of illness   Dispo: The patient is from: Home              Anticipated d/c is to: Home              Anticipated d/c date is: > 3 days  Patient currently is not medically stable to d/c.   Difficult to place patient No      Level of care: Telemetry   Procedures:   No admission procedures for hospital encounter.    Antimicrobials:  Anti-infectives (From admission, onward)   Start     Dose/Rate Route Frequency Ordered Stop   11/08/20 1000  remdesivir 100 mg in sodium chloride 0.9 % 100 mL IVPB        100 mg 200 mL/hr over 30 Minutes Intravenous Daily 11/07/20 1320 11/12/20 0959   11/07/20 1430  remdesivir 200 mg in sodium chloride 0.9% 250 mL IVPB         200 mg 580 mL/hr over 30 Minutes Intravenous Once 11/07/20 1320 11/07/20 1523       Medication:  . vitamin C  500 mg Oral Daily  . baricitinib  4 mg Oral Daily  . chlorpheniramine-HYDROcodone  5 mL Oral Q12H  . enoxaparin (LOVENOX) injection  70 mg Subcutaneous Q24H  . Ipratropium-Albuterol  1 puff Inhalation Q6H  . [START ON 11/11/2020] predniSONE  50 mg Oral Daily  . zinc sulfate  220 mg Oral Daily    acetaminophen, guaiFENesin-dextromethorphan   Objective:   Vitals:   11/07/20 2315 11/08/20 0104 11/08/20 0513 11/08/20 0913  BP:  (!) 154/100 139/84 (!) 150/94  Pulse: 97 99 87 92  Resp: (!) _0 Temp: 99.1 F (37.3 C) 98.9 F (37.2 C) 97.6 F (36.4 C) 98.4 F (36.9 C)  TempSrc: Oral  Oral Oral  SpO2: 97% 96% 96%   Weight:      Height:        Intake/Output Summary (Last 24 hours) at 11/08/2020 6579 Last data filed at 11/08/2020 0010 Gross per 24 hour  Intake 290 ml  Output 625 ml  Net -335 ml   Filed Weights   11/07/20 1436  Weight: (!) 145.2 kg     Examination:   Physical Exam  Constitution:  Alert, cooperative, no distress,  Appears calm and comfortable  Psychiatric: Normal and stable mood and affect, cognition intact,   HEENT: Normocephalic, PERRL, otherwise with in Normal limits  Chest:Chest symmetric Cardio vascular:  S1/S2, RRR, No murmure, No Rubs or Gallops  pulmonary: Diffuse rhonchi, rales respirations mild to moderately labored, negative wheezes / crackles Abdomen: Soft, non-tender, non-distended, bowel sounds,no masses, no organomegaly Muscular skeletal: Limited exam - in bed, able to move all 4 extremities, Normal strength,  Neuro: CNII-XII intact. , normal motor and sensation, reflexes intact  Extremities: No pitting edema lower extremities, +2 pulses  Skin: Dry, warm to touch, negative for any Rashes, No open wounds Wounds: None visible, per nursing documentation     ------------------------------------------------------------------------------------------------------------------------------------------    LABs:  CBC Latest Ref Rng & Units 11/08/2020 11/07/2020 11/02/2020  WBC 4.0 - 10.5 K/uL 5.9 7.4 5.9  Hemoglobin 13.0 - 17.0 g/dL 13.8 14.9 15.5  Hematocrit 39.0 - 52.0 % 42.8 45.7 49.5  Platelets 150 - 400 K/uL 377 360 233   CMP Latest Ref Rng & Units 11/07/2020 11/02/2020  Glucose 70 - 99 mg/dL 123(H) 139(H)  BUN 6 - 20 mg/dL 17 10  Creatinine 0.61 - 1.24 mg/dL 1.18 1.06  Sodium 135 - 145 mmol/L 132(L) 133(L)  Potassium 3.5 - 5.1 mmol/L 4.4 4.1  Chloride 98 - 111 mmol/L 95(L) 97(L)  CO2 22 - 32 mmol/L 22 23  Calcium 8.9 - 10.3 mg/dL 9.3 9.0  Total Protein 6.5 - 8.1 g/dL 9.2(H) -  Total  Bilirubin 0.3 - 1.2 mg/dL 1.2 -  Alkaline Phos 38 - 126 U/L 36(L) -  AST 15 - 41 U/L 71(H) -  ALT 0 - 44 U/L 57(H) -       Micro Results Recent Results (from the past 240 hour(s))  Blood Culture (routine x 2)     Status: None (Preliminary result)   Collection Time: 11/07/20 12:57 PM   Specimen: BLOOD  Result Value Ref Range Status   Specimen Description   Final    BLOOD RIGHT ANTECUBITAL Performed at [facility], [place] [address] [place], [place] [address]    Special Requests   Final    BOTTLES DRAWN AEROBIC AND ANAEROBIC Blood Culture adequate volume Performed at [facility] [address], [place], [place] [address]    Culture PENDING  Incomplete   Report Status PENDING  Incomplete    Radiology Reports CT ANGIO CHEST PE W OR WO CONTRAST  Result Date: 11/07/2020 CLINICAL DATA:  Short of breath, hypoxia, positive D-dimer, COVID-19 EXAM: CT ANGIOGRAPHY CHEST WITH CONTRAST TECHNIQUE: Multidetector CT imaging of the chest was performed using the standard protocol during bolus administration of intravenous contrast. Multiplanar CT image reconstructions and MIPs were obtained to evaluate the vascular anatomy. CONTRAST:  117m  OMNIPAQUE IOHEXOL 350 MG/ML SOLN COMPARISON:  11/07/2020 FINDINGS: Cardiovascular: This is a technically adequate evaluation of the pulmonary vasculature. There are no filling defects or pulmonary emboli. The heart is enlarged without pericardial effusion. No evidence of thoracic aortic aneurysm or dissection. Mediastinum/Nodes: No enlarged mediastinal, hilar, or axillary lymph nodes. Thyroid gland, trachea, and esophagus demonstrate no significant findings. Lungs/Pleura: There is moderate multifocal bilateral ground-glass airspace disease consistent with COVID 19 pneumonia. No effusion or pneumothorax. The central airways are patent. Upper Abdomen: No acute abnormality. Musculoskeletal: No acute or destructive bony lesions. Reconstructed images demonstrate no additional findings. Review of the MIP images confirms the above findings. IMPRESSION: 1. No evidence of pulmonary embolus. 2. Multifocal bilateral pneumonia compatible with COVID 19. Electronically Signed   By: MRanda NgoM.D.   On: 11/07/2020 16:42   DG Chest Port 1 View  Result Date: 11/07/2020 CLINICAL DATA:  Clip EXAM: PORTABLE CHEST 1 VIEW COMPARISON:  November 02, 2020 FINDINGS: The cardiomediastinal silhouette is unchanged in contour.Low lung volumes. No pleural effusion. No pneumothorax. Increased multifocal bilateral peripheral predominant heterogeneous opacities, consistent with the sequela of COVID-19 infection. Visualized abdomen is unremarkable. No acute osseous abnormality. IMPRESSION: Increased multifocal bilateral peripheral predominant heterogeneous opacities, consistent with the sequela of COVID-19 infection. Electronically Signed   By: SValentino SaxonMD   On: 11/07/2020 11:37   DG Chest Portable 1 View  Result Date: 11/02/2020 CLINICAL DATA:  Fever, chills, cough, generalized weakness and shortness of breath. COVID positive. EXAM: PORTABLE CHEST 1 VIEW COMPARISON:  None. FINDINGS: Study is hypoinspiratory. Given the low lung  volumes, lungs appear clear and heart size is likely upper normal. No pleural effusion or pneumothorax is seen. IMPRESSION: Low lung volumes. No evidence of pneumonia. If symptoms persist or worsen, consider repeat chest x-ray with better inspiratory effort. Electronically Signed   By: SFranki CabotM.D.   On: 11/02/2020 15:57   [place]domen Limited RUQ (LIVER/GB)  Result Date: 11/07/2020 CLINICAL DATA:  Elevated LFT EXAM: ULTRASOUND ABDOMEN LIMITED RIGHT UPPER QUADRANT COMPARISON:  None. FINDINGS: Gallbladder: No gallstones or wall thickening visualized. No sonographic Murphy sign noted by sonographer. Common bile duct: Diameter: 3.8 mm Liver: Diffusely echogenic liver without focal liver  lesion. Portal vein is patent on color Doppler imaging with normal direction of blood flow towards the liver. Other: None. IMPRESSION: Fatty liver.  Negative for gallstones or biliary dilatation. Electronically Signed   By: Franchot Gallo M.D.   On: 11/07/2020 16:26    SIGNED: Deatra James, MD, FHM. [facility],  Pager (please use [email or web address] to page/text) Please use Epic Secure Chat for non-urgent communication (7AM-7PM)  If 7PM-7AM, please contact night-coverage [email or web address], 11/08/2020, 9:54 AM

## 2020-11-08 NOTE — Plan of Care (Signed)

## 2020-11-09 DIAGNOSIS — E876 Hypokalemia: Secondary | ICD-10-CM | POA: Diagnosis not present

## 2020-11-09 DIAGNOSIS — J189 Pneumonia, unspecified organism: Secondary | ICD-10-CM | POA: Diagnosis not present

## 2020-11-09 DIAGNOSIS — U071 COVID-19: Secondary | ICD-10-CM | POA: Diagnosis not present

## 2020-11-09 DIAGNOSIS — R7989 Other specified abnormal findings of blood chemistry: Secondary | ICD-10-CM | POA: Diagnosis not present

## 2020-11-09 LAB — CBC WITH DIFFERENTIAL/PLATELET
Abs Immature Granulocytes: 0.05 10*3/uL (ref 0.00–0.07)
Basophils Absolute: 0 10*3/uL (ref 0.0–0.1)
Basophils Relative: 0 %
Eosinophils Absolute: 0 10*3/uL (ref 0.0–0.5)
Eosinophils Relative: 0 %
HCT: 41.9 % (ref 39.0–52.0)
Hemoglobin: 13.4 g/dL (ref 13.0–17.0)
Immature Granulocytes: 1 %
Lymphocytes Relative: 12 %
Lymphs Abs: 1.1 10*3/uL (ref 0.7–4.0)
MCH: 27.9 pg (ref 26.0–34.0)
MCHC: 32 g/dL (ref 30.0–36.0)
MCV: 87.3 fL (ref 80.0–100.0)
Monocytes Absolute: 0.3 10*3/uL (ref 0.1–1.0)
Monocytes Relative: 3 %
Neutro Abs: 8.2 10*3/uL — ABNORMAL HIGH (ref 1.7–7.7)
Neutrophils Relative %: 84 %
Platelets: 420 10*3/uL — ABNORMAL HIGH (ref 150–400)
RBC: 4.8 MIL/uL (ref 4.22–5.81)
RDW: 13.7 % (ref 11.5–15.5)
WBC: 9.7 10*3/uL (ref 4.0–10.5)
nRBC: 0 % (ref 0.0–0.2)

## 2020-11-09 LAB — HEMOGLOBIN A1C
Hgb A1c MFr Bld: 6.2 % — ABNORMAL HIGH (ref 4.8–5.6)
Mean Plasma Glucose: 131.24 mg/dL

## 2020-11-09 LAB — MAGNESIUM: Magnesium: 2.8 mg/dL — ABNORMAL HIGH (ref 1.7–2.4)

## 2020-11-09 LAB — GLUCOSE, CAPILLARY
Glucose-Capillary: 158 mg/dL — ABNORMAL HIGH (ref 70–99)
Glucose-Capillary: 141 mg/dL — ABNORMAL HIGH (ref 70–99)

## 2020-11-09 LAB — FERRITIN: Ferritin: 577 ng/mL — ABNORMAL HIGH (ref 24–336)

## 2020-11-09 LAB — C-REACTIVE PROTEIN: CRP: 6.4 mg/dL — ABNORMAL HIGH (ref <1.0)

## 2020-11-09 LAB — D-DIMER, QUANTITATIVE: D-Dimer, Quant: 5.01 ug/mL-FEU — ABNORMAL HIGH (ref 0.00–0.50)

## 2020-11-09 LAB — PHOSPHORUS: Phosphorus: 4.5 mg/dL (ref 2.5–4.6)

## 2020-11-09 MED ORDER — TOCILIZUMAB 400 MG/20ML IV SOLN: 800.0000 mg | Freq: Once | INTRAVENOUS | Status: DC

## 2020-11-09 MED ORDER — PREDNISONE 20 MG PO TABS: 50.0000 mg | ORAL_TABLET | Freq: Every day | ORAL | Status: DC

## 2020-11-09 MED ORDER — INSULIN ASPART 100 UNIT/ML ~~LOC~~ SOLN: 0.0000 [IU] | Freq: Every day | SUBCUTANEOUS | Status: DC

## 2020-11-09 MED ORDER — INSULIN ASPART 100 UNIT/ML ~~LOC~~ SOLN
0.0000 [IU] | Freq: Three times a day (TID) | SUBCUTANEOUS | Status: DC
  Administered 2020-11-09: 2 [IU] via SUBCUTANEOUS
  Administered 2020-11-10: 3 [IU] via SUBCUTANEOUS
  Administered 2020-11-10: 1 [IU] via SUBCUTANEOUS
  Administered 2020-11-10: 2 [IU] via SUBCUTANEOUS
  Administered 2020-11-11: 5 [IU] via SUBCUTANEOUS

## 2020-11-09 MED ORDER — SODIUM CHLORIDE 0.9 % IV SOLN
145.0000 mg | Freq: Two times a day (BID) | INTRAVENOUS | Status: DC
  Administered 2020-11-09: 150 mg via INTRAVENOUS
  Filled 2020-11-09 (×2): qty 1.2

## 2020-11-09 NOTE — Progress Notes (Signed)
PROGRESS NOTE    Patient: Kevin Brock                            PCP: Patient, No Pcp Per                    DOB: 26-May-1986            DOA: 11/07/2020 [id number]             DOS: 11/09/2020, 10:38 AM   LOS: 2 days   Date of Service: The patient was seen and examined on 11/09/2020  Subjective:   The patient was seen and examined this morning, in mild to moderate respiratory distress, O2 demand has increased to 9 L now, satting 96-98% Continue to complain of shortness of breath cough worse with exertion   Brief Narrative:   Abbie Jablon is a 35 y.o. male with noe significant medical history. Presenting with fevers, chills and dyspnea for past 2 weeks. He initially tried APAP, robitussin, and nyquil to help. However he received no relief. After dealing with his symptoms for several days, he went to urgent care. He was told that he was positive for COVID and that he should self isolate. So he went home to quarantine. However, he symptoms only worsen. He decided that he would come to the ED. He denies any other alleviating or aggravating factors. He denies any other treatments.   ED Course: He was found to be hypoxic. Started on remdes and steroids.    Assessment & Plan:   Active Problems:   COVID-19  COVID 19 PNA - Acute hypoxic respiratory failure secondary Covid infection -Sepsis -source of infection SARS-CoV-2-met the criteria on admission with T-max of 103, pulse of 120, respiratory rate as high as 40, hypoxic on room air -improving continue to monitor closely - Tachypnea, tachycardia improving -lactic acid 1.1, procalcitonin <0.10  Acute respiratory failure due to SARS-CoV-2 -Initially required to, 4, 7 now on 9 L of O2 by nasal cannula (satting 96-98%) -RT consulted likely will initiate high flow heated O2 by nasal cannula  - Continue IV remdesivir, steroids, Bariticinib (procal is negative), inhalers, anti-tussives, IS, FV - D-dimer is elevated, -  CTA PE >> no  evidence of PE, multifocal pneumonia -Trending and following: Inflammatory markers -improving CRP down from 16-6.4, ferritin 600 >> 577, -Initiating gentle IV fluid hydration will discontinue today     - sepsis: tachycardia, tachypnea, source: covid, elevated LFTs and respiratory failure  Elevated LFTs     - likely secondary to sepsis physiology, Covid infection     -We will monitor closely         -Hepatitis panel nonreactive-  -Abdominal ultrasound: Reviewed,Fatty liver.  Negative for gallstones or biliary dilatation.  Hyponatremia     - fluids, follow labs, improved  Hypertension -Blood pressure elevated on admission 150/94 currently 139/82 -No history of hypertension not on any BP meds -Elevated likely due to above, will continue to monitor as needed hydralazine  ------------------------------------------------------------------------------------------------------------------------------------ Nutritional status:  The patient's BMI is: Body mass index is 40 kg/m. I agree with the assessment and plan as outlined below:  ------------------------------------------------------------------------------------------------------------------------------------ Cultures; Blood cultures x2  Antimicrobials: None   Consultants: None ------------------------------------------------------------------------------------------------------------------------------------  DVT prophylaxis:  Lovenox SQ Code Status:   Code Status: Full Code  Family Communication: No family member present at bedside-daily The above findings and plan of care has been discussed with patient  in detail,  they  expressed understanding and agreement of above.    Admission status:   Status is: Inpatient  Remains inpatient appropriate because:Inpatient level of care appropriate due to severity of illness   Dispo: The patient is from: Home              Anticipated d/c is to: Home              Anticipated d/c  date is: > 3 days              Patient currently is not medically stable to d/c.   Difficult to place patient No      Level of care: Telemetry   Procedures:   No admission procedures for hospital encounter.    Antimicrobials:  Anti-infectives (From admission, onward)   Start     Dose/Rate Route Frequency Ordered Stop   11/08/20 1000  remdesivir 100 mg in sodium chloride 0.9 % 100 mL IVPB        100 mg 200 mL/hr over 30 Minutes Intravenous Daily 11/07/20 1320 11/12/20 0959   11/07/20 1430  remdesivir 200 mg in sodium chloride 0.9% 250 mL IVPB        200 mg 580 mL/hr over 30 Minutes Intravenous Once 11/07/20 1320 11/07/20 1523       Medication:  . vitamin C  500 mg Oral Daily  . baricitinib  4 mg Oral Daily  . chlorpheniramine-HYDROcodone  5 mL Oral Q12H  . enoxaparin (LOVENOX) injection  70 mg Subcutaneous Q24H  . Ipratropium-Albuterol  1 puff Inhalation Q6H  . [START ON 11/11/2020] predniSONE  50 mg Oral Daily  . zinc sulfate  220 mg Oral Daily    acetaminophen, guaiFENesin-dextromethorphan   Objective:   Vitals:   11/08/20 2253 11/09/20 0122 11/09/20 0454 11/09/20 0844  BP: (!) 143/85 (!) 143/90 (!) 143/76 139/82  Pulse: 80 82 89 76  Resp: (!) 34 (!) 36 (!) 28 14  Temp: 98.3 F (36.8 C) 98.6 F (37 C) 98.1 F (36.7 C) 98.4 F (36.9 C)  TempSrc: Oral Oral Oral   SpO2: 100% 100% 99% 98%  Weight:      Height:        Intake/Output Summary (Last 24 hours) at 11/09/2020 1038 Last data filed at 11/09/2020 8882 Gross per 24 hour  Intake 2631.36 ml  Output 600 ml  Net 2031.36 ml   Filed Weights   11/07/20 1436  Weight: (!) 145.2 kg     Examination:      Physical Exam:   General:  Alert, oriented, cooperative, in respiratory distress with shortness of breath  HEENT:  Normocephalic, PERRL, otherwise with in Normal limits   Neuro:  CNII-XII intact. , normal motor and sensation, reflexes intact   Lungs:    Diffuse Rales, rhonchi's, diminished breath  sounds lower lobe to mid respirations mild-moderately labored, no wheezes / crackles  Cardio:    S1/S2, RRR, No murmure, No Rubs or Gallops   Abdomen:   Soft, non-tender, bowel sounds active all four quadrants,  no guarding or peritoneal signs.  Muscular skeletal:  Limited exam - in bed, able to move all 4 extremities, Normal strength,  2+ pulses,  symmetric, No pitting edema  Skin:  Dry, warm to touch, negative for any Rashes,  Wounds: Please see nursing documentation        ---------------------------------------------------------------------------------------------------------------------------------    LABs:  CBC Latest Ref Rng & Units 11/09/2020 11/08/2020 11/07/2020  WBC 4.0 - 10.5 K/uL 9.7 5.9 7.4  Hemoglobin  13.0 - 17.0 g/dL 13.4 13.8 14.9  Hematocrit 39.0 - 52.0 % 41.9 42.8 45.7  Platelets 150 - 400 K/uL 420(H) 377 360   CMP Latest Ref Rng & Units 11/07/2020 11/02/2020  Glucose 70 - 99 mg/dL 123(H) 139(H)  BUN 6 - 20 mg/dL 17 10  Creatinine 0.61 - 1.24 mg/dL 1.18 1.06  Sodium 135 - 145 mmol/L 132(L) 133(L)  Potassium 3.5 - 5.1 mmol/L 4.4 4.1  Chloride 98 - 111 mmol/L 95(L) 97(L)  CO2 22 - 32 mmol/L 22 23  Calcium 8.9 - 10.3 mg/dL 9.3 9.0  Total Protein 6.5 - 8.1 g/dL 9.2(H) -  Total Bilirubin 0.3 - 1.2 mg/dL 1.2 -  Alkaline Phos 38 - 126 U/L 36(L) -  AST 15 - 41 U/L 71(H) -  ALT 0 - 44 U/L 57(H) -       Micro Results Recent Results (from the past 240 hour(s))  Blood Culture (routine x 2)     Status: None (Preliminary result)   Collection Time: 11/07/20 12:13 PM   Specimen: BLOOD  Result Value Ref Range Status   Specimen Description   Final    BLOOD RIGHT ANTECUBITAL Performed at [facility] [address], [place], [place] [address]    Special Requests   Final    BOTTLES DRAWN AEROBIC AND ANAEROBIC Blood Culture results may not be optimal due to an inadequate volume of blood received in culture bottles Performed at [facility] [address] [place], [place] [address]    Culture   Final    NO GROWTH 1 DAY Performed at [facility], [place] [address] [place], [place] [address]    Report Status PENDING  Incomplete  Blood Culture (routine x 2)     Status: None (Preliminary result)   Collection Time: 11/07/20 12:57 PM   Specimen: BLOOD  Result Value Ref Range Status   Specimen Description   Final    BLOOD RIGHT ANTECUBITAL Performed at [facility], [place] [address] [place], [place] [address]    Special Requests   Final    BOTTLES DRAWN AEROBIC AND ANAEROBIC Blood Culture adequate volume Performed at [facility] [address] [place], [place] [address]    Culture   Final    NO GROWTH < 24 HOURS Performed at [facility] [address], [place],  [address]    Report Status PENDING  Incomplete    Radiology Reports CT ANGIO CHEST PE W OR WO CONTRAST  Result Date: 11/07/2020 CLINICAL DATA:  Short of breath, hypoxia, positive D-dimer, COVID-19 EXAM: CT ANGIOGRAPHY CHEST WITH CONTRAST TECHNIQUE: Multidetector CT imaging of the chest was performed using the standard protocol during bolus administration of intravenous contrast. Multiplanar CT image reconstructions and MIPs were obtained to evaluate the vascular anatomy. CONTRAST:  118m OMNIPAQUE IOHEXOL 350 MG/ML SOLN COMPARISON:  11/07/2020 FINDINGS: Cardiovascular: This is a technically adequate evaluation of the pulmonary vasculature. There are no filling defects or pulmonary emboli. The heart is enlarged without pericardial effusion. No evidence of thoracic aortic aneurysm or dissection. Mediastinum/Nodes: No enlarged mediastinal, hilar, or axillary lymph nodes. Thyroid gland, trachea, and esophagus demonstrate no significant findings. Lungs/Pleura: There is moderate multifocal bilateral ground-glass airspace disease consistent with COVID 19 pneumonia. No effusion or pneumothorax. The central airways are  patent. Upper Abdomen: No acute abnormality. Musculoskeletal: No acute or destructive bony lesions. Reconstructed images demonstrate no additional findings. Review of the MIP images confirms  the above findings. IMPRESSION: 1. No evidence of pulmonary embolus. 2. Multifocal bilateral pneumonia compatible with COVID 19. Electronically Signed   By: Randa Ngo M.D.   On: 11/07/2020 16:42   DG Chest Port 1 View  Result Date: 11/07/2020 CLINICAL DATA:  Clip EXAM: PORTABLE CHEST 1 VIEW COMPARISON:  November 02, 2020 FINDINGS: The cardiomediastinal silhouette is unchanged in contour.Low lung volumes. No pleural effusion. No pneumothorax. Increased multifocal bilateral peripheral predominant heterogeneous opacities, consistent with the sequela of COVID-19 infection. Visualized abdomen is unremarkable. No acute osseous abnormality. IMPRESSION: Increased multifocal bilateral peripheral predominant heterogeneous opacities, consistent with the sequela of COVID-19 infection. Electronically Signed   By: Valentino Saxon MD   On: 11/07/2020 11:37   DG Chest Portable 1 View  Result Date: 11/02/2020 CLINICAL DATA:  Fever, chills, cough, generalized weakness and shortness of breath. COVID positive. EXAM: PORTABLE CHEST 1 VIEW COMPARISON:  None. FINDINGS: Study is hypoinspiratory. Given the low lung volumes, lungs appear clear and heart size is likely upper normal. No pleural effusion or pneumothorax is seen. IMPRESSION: Low lung volumes. No evidence of pneumonia. If symptoms persist or worsen, consider repeat chest x-ray with better inspiratory effort. Electronically Signed   By: Franki Cabot M.D.   On: 11/02/2020 15:57   US Abdomen Limited RUQ (LIVER/GB)  Result Date: 11/07/2020 CLINICAL DATA:  Elevated LFT EXAM: ULTRASOUND ABDOMEN LIMITED RIGHT UPPER QUADRANT COMPARISON:  None. FINDINGS: Gallbladder: No gallstones or wall thickening visualized. No sonographic Murphy sign noted by sonographer. Common bile duct:  Diameter: 3.8 mm Liver: Diffusely echogenic liver without focal liver lesion. Portal vein is patent on color Doppler imaging with normal direction of blood flow towards the liver. Other: None. IMPRESSION: Fatty liver.  Negative for gallstones or biliary dilatation. Electronically Signed   By: Franchot Gallo M.D.   On: 11/07/2020 16:26    SIGNED: Deatra James, MD, FHM. [facility],  Pager (please use [email or web address] to page/text) Please use Epic Secure Chat for non-urgent communication (7AM-7PM)  If 7PM-7AM, please contact night-coverage [email or web address], 11/09/2020, 10:38 AM

## 2020-11-10 DIAGNOSIS — U071 COVID-19: Secondary | ICD-10-CM | POA: Diagnosis not present

## 2020-11-10 LAB — CBC WITH DIFFERENTIAL/PLATELET
Abs Immature Granulocytes: 0.12 10*3/uL — ABNORMAL HIGH (ref 0.00–0.07)
Basophils Absolute: 0 10*3/uL (ref 0.0–0.1)
Basophils Relative: 0 %
Eosinophils Absolute: 0 10*3/uL (ref 0.0–0.5)
Eosinophils Relative: 0 %
HCT: 42.5 % (ref 39.0–52.0)
Hemoglobin: 13.6 g/dL (ref 13.0–17.0)
Immature Granulocytes: 1 %
Lymphocytes Relative: 9 %
Lymphs Abs: 1 10*3/uL (ref 0.7–4.0)
MCH: 27.9 pg (ref 26.0–34.0)
MCHC: 32 g/dL (ref 30.0–36.0)
MCV: 87.3 fL (ref 80.0–100.0)
Monocytes Absolute: 0.4 10*3/uL (ref 0.1–1.0)
Monocytes Relative: 4 %
Neutro Abs: 9.2 10*3/uL — ABNORMAL HIGH (ref 1.7–7.7)
Neutrophils Relative %: 86 %
Platelets: 456 10*3/uL — ABNORMAL HIGH (ref 150–400)
RBC: 4.87 MIL/uL (ref 4.22–5.81)
RDW: 13.4 % (ref 11.5–15.5)
WBC: 10.7 10*3/uL — ABNORMAL HIGH (ref 4.0–10.5)
nRBC: 0 % (ref 0.0–0.2)

## 2020-11-10 LAB — FERRITIN: Ferritin: 517 ng/mL — ABNORMAL HIGH (ref 24–336)

## 2020-11-10 LAB — GLUCOSE, CAPILLARY
Glucose-Capillary: 145 mg/dL — ABNORMAL HIGH (ref 70–99)
Glucose-Capillary: 225 mg/dL — ABNORMAL HIGH (ref 70–99)
Glucose-Capillary: 152 mg/dL — ABNORMAL HIGH (ref 70–99)
Glucose-Capillary: 159 mg/dL — ABNORMAL HIGH (ref 70–99)

## 2020-11-10 LAB — PHOSPHORUS: Phosphorus: 3.5 mg/dL (ref 2.5–4.6)

## 2020-11-10 LAB — HEPATIC FUNCTION PANEL
ALT: 93 U/L — ABNORMAL HIGH (ref 0–44)
AST: 38 U/L (ref 15–41)
Albumin: 3.3 g/dL — ABNORMAL LOW (ref 3.5–5.0)
Alkaline Phosphatase: 35 U/L — ABNORMAL LOW (ref 38–126)
Bilirubin, Direct: 0.1 mg/dL (ref 0.0–0.2)
Indirect Bilirubin: 0.4 mg/dL (ref 0.3–0.9)
Total Bilirubin: 0.5 mg/dL (ref 0.3–1.2)
Total Protein: 7.1 g/dL (ref 6.5–8.1)

## 2020-11-10 LAB — MAGNESIUM: Magnesium: 2.7 mg/dL — ABNORMAL HIGH (ref 1.7–2.4)

## 2020-11-10 LAB — C-REACTIVE PROTEIN: CRP: 2.6 mg/dL — ABNORMAL HIGH (ref <1.0)

## 2020-11-10 LAB — D-DIMER, QUANTITATIVE: D-Dimer, Quant: 2.67 ug/mL-FEU — ABNORMAL HIGH (ref 0.00–0.50)

## 2020-11-10 MED ORDER — METHYLPREDNISOLONE SODIUM SUCC 125 MG IJ SOLR
60.0000 mg | Freq: Two times a day (BID) | INTRAMUSCULAR | Status: DC
  Administered 2020-11-10 – 2020-11-11 (×3): 60 mg via INTRAVENOUS
  Filled 2020-11-10 (×3): qty 2

## 2020-11-10 MED ORDER — HYDROCHLOROTHIAZIDE 12.5 MG PO CAPS
12.5000 mg | ORAL_CAPSULE | Freq: Every day | ORAL | Status: DC
  Administered 2020-11-10 – 2020-11-11 (×2): 12.5 mg via ORAL
  Filled 2020-11-10 (×2): qty 1

## 2020-11-10 NOTE — Plan of Care (Signed)
  Problem: Clinical Measurements: Goal: Will remain free from infection Outcome: Progressing   Problem: Clinical Measurements: Goal: Respiratory complications will improve Outcome: Progressing   Problem: Activity: Goal: Risk for activity intolerance will decrease Outcome: Progressing   

## 2020-11-10 NOTE — Progress Notes (Signed)
PROGRESS NOTE  Kevin Brock  DOB: [birth year]  PCP: Patient, No Pcp Per ZES:[id number]  DOA: 11/07/2020  LOS: 3 days   Chief Complaint  Patient presents with  . Covid Positive  . Fever   Brief narrative: Kevin Brock is a 35 y.o. male with morbid obesity. Patient presented to the ED on 11/07/2020 with complaint of fever, chills, dyspnea progressively worsening for last 2 weeks and not improving with over-the-counter medications. He tested positive for Covid in an urgent care visit.  Symptoms worsened at home and hence came to the ED on 2/4.  In the ED, he was found to be hypoxic, required 9 L oxygen by nasal cannula. CT angio of chest showed multifocal bilateral pneumonia. Admitted for treatment of Covid pneumonia.    Subjective: Patient was seen and examined this morning. Propped up in bed.  Not in distress.  He was not on supplemental oxygen at the time of my evaluation this morning.  Feels better.  Assessment/Plan: COVID pneumonia Acute respiratory failure with hypoxia  -Presented with worsening Covid symptoms -COVID test: PCR positive -Chest imaging: CT chest with multifocal bilateral pneumonia  -Treatment: 5-day course of IV remdesivir to complete on 2/8.  He has been receiving very high-dose of Solu-Medrol at 150 mg twice daily.  I switched it down to 60 mg daily this morning.  He has also been receiving baricitinib 4 mg since 2/5. -Progression: Clinically improving.  Oxygen requirement significantly better.  CRP improving as well. -Hopefully home tomorrow on oral steroids. -Continue supportive care: Vitamin C, Zinc, PRN inhalers, Tylenol, Antitussives (benzonatate/ Mucinex/Tussionex).   -Encouraged incentive spirometry, prone position, out of bed and early mobilization as much as possible -Continue airborne/contact isolation precautions for duration of 3 weeks from the day of diagnosis. -WBC and inflammatory markers trend as below.  Recent Labs  Lab 11/07/20 1110  11/07/20 1213 11/07/20 1245 11/07/20 1310 11/08/20 0448 11/09/20 0428 11/10/20 0327  WBC  --   --  7.4  --  5.9 9.7 10.7*  LATICACIDVEN 1.2  --   --  1.1  --   --   --   PROCALCITON  --  <0.10  --   --   --   --   --   DDIMER  --   --  1.98*  --  1.77* 5.01* 2.67*  FERRITIN 583*  --   --   --  600* 577* 517*  LDH  --  413*  --   --   --   --   --   CRP 18.2*  --   --   --  16.0* 6.4* 2.6*  ALT  --  57*  --   --   --   --   --   The treatment plan and use of medications and known side effects were discussed with patient/family. Some of the medications used are based on case reports/anecdotal data.  All other medications being used in the management of COVID-19 based on limited study data.  Complete risks and long-term side effects are unknown, however in the best clinical judgment they seem to be of some benefit.  Patient wanted to proceed with treatment options provided.  Fatty liver Elevated LFTs -Liver ultrasound showed fatty liver, probably related to weight. -Monitor liver enzymes level. Recent Labs  Lab 11/07/20 1213  AST 71*  ALT 57*  ALKPHOS 36*  BILITOT 1.2  PROT 9.2*  ALBUMIN 4.3   Hyponatremia -Probably because of poor oral intake.  Repeat Recent  Labs  Lab 11/07/20 1213  NA 132*   Hypertension -Probably has undiagnosed underlying hypertension.  Blood pressure remains elevated in 150s to 160s.   -I will start the patient on HCTZ 12.5 mg daily.   -Continue to monitor.  IV hydralazine as needed.    Morbid obesity - Body mass index is 40 kg/m. Patient has been advised to make an attempt to improve diet and exercise patterns to aid in weight loss.  Mobility: Encourage ambulation Code Status:   Code Status: Full Code  Nutritional status: Body mass index is 40 kg/m.     Diet Order            Diet Heart Room service appropriate? Yes; Fluid consistency: Thin  Diet effective now                 DVT prophylaxis: Lovenox subcu   Antimicrobials:   None Fluid: Stop IV fluid today Consultants: None Family Communication:  None at bedside.  Patient self updating  Status is: Inpatient  Remains inpatient appropriate because -patient was to complete IV remdesivir last dose tomorrow prior to discharge  Dispo: The patient is from: Home              Anticipated d/c is to: Home              Anticipated d/c date is: 1 day              Patient currently is not medically stable to d/c.   Difficult to place patient No       Infusions:  . remdesivir 100 mg in NS 100 mL 100 mg (11/10/20 1035)    Scheduled Meds: . vitamin C  500 mg Oral Daily  . baricitinib  4 mg Oral Daily  . chlorpheniramine-HYDROcodone  5 mL Oral Q12H  . enoxaparin (LOVENOX) injection  70 mg Subcutaneous Q24H  . hydrochlorothiazide  12.5 mg Oral Daily  . insulin aspart  0-5 Units Subcutaneous QHS  . insulin aspart  0-9 Units Subcutaneous TID WC  . Ipratropium-Albuterol  1 puff Inhalation Q6H  . methylPREDNISolone (SOLU-MEDROL) injection  60 mg Intravenous Q12H  . zinc sulfate  220 mg Oral Daily    Antimicrobials: Anti-infectives (From admission, onward)   Start     Dose/Rate Route Frequency Ordered Stop   11/08/20 1000  remdesivir 100 mg in sodium chloride 0.9 % 100 mL IVPB        100 mg 200 mL/hr over 30 Minutes Intravenous Daily 11/07/20 1320 11/12/20 0959   11/07/20 1430  remdesivir 200 mg in sodium chloride 0.9% 250 mL IVPB        200 mg 580 mL/hr over 30 Minutes Intravenous Once 11/07/20 1320 11/07/20 1523      PRN meds: acetaminophen, guaiFENesin-dextromethorphan   Objective: Vitals:   11/10/20 0816 11/10/20 1400  BP: (!) 156/120   Pulse: 80   Resp: 20   Temp: 98.1 F (36.7 C)   SpO2: 100% 93%    Intake/Output Summary (Last 24 hours) at 11/10/2020 1526 Last data filed at 11/10/2020 1500 Gross per 24 hour  Intake 1280 ml  Output 2200 ml  Net -920 ml   Filed Weights   11/07/20 1436  Weight: (!) 145.2 kg   Weight change:  Body mass  index is 40 kg/m.   Physical Exam: General exam: Pleasant, young, morbidly obese African-American male. Skin: No rashes, lesions or ulcers. HEENT: Atraumatic, normocephalic, no obvious bleeding Lungs: Clear to auscultation bilaterally CVS:  Regular rate and rhythm, no murmur GI/Abd soft, nontender, distended from obesity, bowel sound present CNS: Alert, awake, oriented x3 Psychiatry: Mood appropriate Extremities: No pedal edema, no calf tenderness  Data Review: I have personally reviewed the laboratory data and studies available.  Recent Labs  Lab 11/07/20 1245 11/08/20 0448 11/09/20 0428 11/10/20 0327  WBC 7.4 5.9 9.7 10.7*  NEUTROABS 5.7 4.8 8.2* 9.2*  HGB 14.9 13.8 13.4 13.6  HCT 45.7 42.8 41.9 42.5  MCV 86.4 87.5 87.3 87.3  PLT 360 377 420* 456*   Recent Labs  Lab 11/07/20 1213 11/08/20 0448 11/09/20 0428 11/10/20 0327  NA 132*  --   --   --   K 4.4  --   --   --   CL 95*  --   --   --   CO2 22  --   --   --   GLUCOSE 123*  --   --   --   BUN 17  --   --   --   CREATININE 1.18  --   --   --   CALCIUM 9.3  --   --   --   MG  --  2.8* 2.8* 2.7*  PHOS  --  4.1 4.5 3.5    F/u labs ordered  Signed, Lorin Glass, MD [facility] 11/10/2020

## 2020-11-11 DIAGNOSIS — U071 COVID-19: Secondary | ICD-10-CM | POA: Diagnosis not present

## 2020-11-11 DIAGNOSIS — J1282 Pneumonia due to coronavirus disease 2019: Secondary | ICD-10-CM

## 2020-11-11 LAB — CBC WITH DIFFERENTIAL/PLATELET
Abs Immature Granulocytes: 0.23 10*3/uL — ABNORMAL HIGH (ref 0.00–0.07)
Basophils Absolute: 0 10*3/uL (ref 0.0–0.1)
Basophils Relative: 0 %
Eosinophils Absolute: 0 10*3/uL (ref 0.0–0.5)
Eosinophils Relative: 0 %
HCT: 47.2 % (ref 39.0–52.0)
Hemoglobin: 15.2 g/dL (ref 13.0–17.0)
Immature Granulocytes: 2 %
Lymphocytes Relative: 11 %
Lymphs Abs: 1.1 10*3/uL (ref 0.7–4.0)
MCH: 28.3 pg (ref 26.0–34.0)
MCHC: 32.2 g/dL (ref 30.0–36.0)
MCV: 87.9 fL (ref 80.0–100.0)
Monocytes Absolute: 0.6 10*3/uL (ref 0.1–1.0)
Monocytes Relative: 6 %
Neutro Abs: 8.4 10*3/uL — ABNORMAL HIGH (ref 1.7–7.7)
Neutrophils Relative %: 81 %
Platelets: 487 10*3/uL — ABNORMAL HIGH (ref 150–400)
RBC: 5.37 MIL/uL (ref 4.22–5.81)
RDW: 13.2 % (ref 11.5–15.5)
WBC: 10.4 10*3/uL (ref 4.0–10.5)
nRBC: 0 % (ref 0.0–0.2)

## 2020-11-11 LAB — BASIC METABOLIC PANEL WITH GFR
Anion gap: 13 (ref 5–15)
BUN: 20 mg/dL (ref 6–20)
CO2: 21 mmol/L — ABNORMAL LOW (ref 22–32)
Calcium: 8.7 mg/dL — ABNORMAL LOW (ref 8.9–10.3)
Chloride: 97 mmol/L — ABNORMAL LOW (ref 98–111)
Creatinine, Ser: 0.96 mg/dL (ref 0.61–1.24)
GFR, Estimated: >60 mL/min
Glucose, Bld: 246 mg/dL — ABNORMAL HIGH (ref 70–99)
Potassium: 4.5 mmol/L (ref 3.5–5.1)
Sodium: 131 mmol/L — ABNORMAL LOW (ref 135–145)

## 2020-11-11 LAB — MAGNESIUM: Magnesium: 2.5 mg/dL — ABNORMAL HIGH (ref 1.7–2.4)

## 2020-11-11 LAB — D-DIMER, QUANTITATIVE: D-Dimer, Quant: 2.12 ug/mL-FEU — ABNORMAL HIGH (ref 0.00–0.50)

## 2020-11-11 LAB — FERRITIN: Ferritin: 548 ng/mL — ABNORMAL HIGH (ref 24–336)

## 2020-11-11 LAB — C-REACTIVE PROTEIN: CRP: 1.4 mg/dL — ABNORMAL HIGH

## 2020-11-11 LAB — GLUCOSE, CAPILLARY: Glucose-Capillary: 259 mg/dL — ABNORMAL HIGH (ref 70–99)

## 2020-11-11 LAB — PHOSPHORUS: Phosphorus: 3.7 mg/dL (ref 2.5–4.6)

## 2020-11-11 MED ORDER — GUAIFENESIN-DM 100-10 MG/5ML PO SYRP: 10.0000 mL | ORAL_SOLUTION | ORAL | 0 refills | Status: AC | PRN

## 2020-11-11 MED ORDER — PREDNISONE 10 MG PO TABS: ORAL_TABLET | ORAL | 0 refills | Status: AC

## 2020-11-11 MED ORDER — IPRATROPIUM-ALBUTEROL 20-100 MCG/ACT IN AERS: 1.0000 | INHALATION_SPRAY | Freq: Four times a day (QID) | RESPIRATORY_TRACT | Status: DC | PRN

## 2020-11-11 NOTE — Discharge Instructions (Signed)

## 2020-11-11 NOTE — Plan of Care (Signed)
  Problem: Education: Goal: Knowledge of General Education information will improve Description: Including pain rating scale, medication(s)/side effects and non-pharmacologic comfort measures Outcome: Adequate for Discharge   Problem: Clinical Measurements: Goal: Ability to maintain clinical measurements within normal limits will improve Outcome: Adequate for Discharge Goal: Will remain free from infection Outcome: Adequate for Discharge Goal: Diagnostic test results will improve Outcome: Adequate for Discharge Goal: Respiratory complications will improve Outcome: Adequate for Discharge Goal: Cardiovascular complication will be avoided Outcome: Adequate for Discharge   Problem: Activity: Goal: Risk for activity intolerance will decrease Outcome: Adequate for Discharge   Problem: Nutrition: Goal: Adequate nutrition will be maintained Outcome: Adequate for Discharge

## 2020-11-11 NOTE — Discharge Summary (Signed)
Physician Discharge Summary  Kevin Brock OZH:[id number] DOB: [birth year] DOA: 11/07/2020  PCP: Patient, No Pcp Per  Admit date: 11/07/2020 Discharge date: 11/11/2020  Admitted From: Home Discharge disposition: Home   Code Status: Full Code  Diet Recommendation: Cardiac diet  Discharge Diagnosis:   Principal Problem:   Pneumonia due to COVID-19 virus Active Problems:   Obesity, Class III, BMI 40-49.9 (morbid obesity) [facility])   Chief Complaint  Patient presents with  . Covid Positive  . Fever   Brief narrative: Kevin Brock is a 35 y.o. male with morbid obesity. Patient presented to the ED on 11/07/2020 with complaint of fever, chills, dyspnea progressively worsening for last 2 weeks and not improving with over-the-counter medications. He tested positive for Covid in an urgent care visit.  Symptoms worsened at home and hence came to the ED on 2/4.  In the ED, he was found to be hypoxic, required 9 L oxygen by nasal cannula. CT angio of chest showed multifocal bilateral pneumonia. Admitted for treatment of Covid pneumonia.    Subjective: Patient was seen and examined this morning. Propped up in bed.  Not in distress.  He was not on supplemental oxygen at the time of my evaluation this morning.  Feels better.  Feels ready to go home.  Hospital course: COVID pneumonia Acute respiratory failure with hypoxia  -Presented with worsening Covid symptoms -COVID test: PCR positive -Chest imaging: CT chest with multifocal bilateral pneumonia  -Treatment: Patient completed 5-day course of IV remdesivir today.  He is currently on Solu-Medrol 60 mg IV twice daily.  We will switch him to prednisone and discharge tapering course.  -Progression: Clinically improving.  Not requiring supplemental oxygen for last 24 hours. -Continue supportive care at home. -Continue airborne/contact isolation precautions for duration of 3 weeks from the day of diagnosis. -WBC and inflammatory markers trend  as below.  Recent Labs  Lab 11/07/20 1110 11/07/20 1213 11/07/20 1245 11/07/20 1310 11/08/20 0448 11/09/20 0428 11/10/20 0327 11/11/20 0409  WBC  --   --  7.4  --  5.9 9.7 10.7* 10.4  LATICACIDVEN 1.2  --   --  1.1  --   --   --   --   PROCALCITON  --  <0.10  --   --   --   --   --   --   DDIMER  --   --  1.98*  --  1.77* 5.01* 2.67* 2.12*  FERRITIN 583*  --   --   --  600* 577* 517* 548*  LDH  --  413*  --   --   --   --   --   --   CRP 18.2*  --   --   --  16.0* 6.4* 2.6* 1.4*  ALT  --  57*  --   --   --   --  93*  --   The treatment plan and use of medications and known side effects were discussed with patient/family. Some of the medications used are based on case reports/anecdotal data.  All other medications being used in the management of COVID-19 based on limited study data.  Complete risks and long-term side effects are unknown, however in the best clinical judgment they seem to be of some benefit.  Patient wanted to proceed with treatment options provided.  Fatty liver Elevated LFTs -Liver ultrasound showed fatty liver, probably related to weight. -Liver enzyme level stable. Recent Labs  Lab 11/07/20 1213 11/10/20 0327  AST 71*  38  ALT 57* 93*  ALKPHOS 36* 35*  BILITOT 1.2 0.5  PROT 9.2* 7.1  ALBUMIN 4.3 3.3*   Hyponatremia -Probably because of poor oral intake.  Repeat BMP as an outpatient. Recent Labs  Lab 11/07/20 1213 11/11/20 0409  NA 132* 131*   Elevated blood pressure -Patient does not have history of hypertension but several of his blood pressure readings in the hospital were elevated to 150s and 160s.  Probably was related to work of breathing.  Blood pressure is now stable.  Continue to monitor as an outpatient.    Morbid obesity - Body mass index is 40 kg/m. Patient has been advised to make an attempt to improve diet and exercise patterns to aid in weight loss.  Patient may have underlying sleep apnea.  Needs to get a sleep study as an  outpatient.  Stable for discharge to home today.  Wound care:    Discharge Exam:   Vitals:   11/10/20 0816 11/10/20 1400 11/10/20 2042 11/11/20 0401  BP: (!) 156/120  125/80 (!) 146/81  Pulse: 80  78 88  Resp: 20  19 19   Temp: 98.1 F (36.7 C)  98.2 F (36.8 C) 98.1 F (36.7 C)  TempSrc:      SpO2: 100% 93% 94% 94%  Weight:      Height:        Body mass index is 40 kg/m.  General exam: Pleasant, morbidly obese young African-American male. Skin: No rashes, lesions or ulcers. HEENT: Atraumatic, normocephalic, no obvious bleeding Lungs: Clear to auscultation bilaterally CVS: Regular rate and rhythm, no murmur GI/Abd soft, nontender, distended from obesity, bowel sound present CNS: Alert, awake and oriented x3 Psychiatry: Mood appropriate Extremities: No pedal edema, no calf tenderness  Follow ups:   Discharge Instructions    Ambulatory referral to Sleep Studies   Complete by: As directed    Diet general   Complete by: As directed    Increase activity slowly   Complete by: As directed       Follow-up Information    [facility] Follow up.   Specialty: Internal Medicine Contact information: [address] [place] [id number] [phone]       [facility] Follow up.   Contact information: [address] [place] [phone]              Recommendations for Outpatient Follow-Up:   1. Follow-up with PCP as an outpatient 2. Follow-up with sleep center for sleep study  Discharge Instructions:  Follow with Primary MD Patient, No Pcp Per in 7 days   Get CBC/BMP checked in next visit within 1 week by PCP or SNF MD ( we routinely change or add medications that can affect your baseline labs and fluid status, therefore we recommend that you get the mentioned basic workup next visit with your PCP, your PCP may decide not to get them or add new tests based on  their clinical decision)  On your next visit with your PCP, please Get Medicines reviewed and adjusted.  Please request your PCP  to go over all Hospital Tests and Procedure/Radiological results at the follow up, please get all Hospital records sent to your Prim MD by signing hospital release before you go home.  Activity: As tolerated with Full fall precautions use walker/cane & assistance as needed  For Heart failure patients - Check your Weight same time everyday, if you gain  over 2 pounds, or you develop in leg swelling, experience more shortness of breath or chest pain, call your Primary MD immediately. Follow Cardiac Low Salt Diet and 1.5 lit/day fluid restriction.  If you have smoked or chewed Tobacco in the last 2 yrs please stop smoking, stop any regular Alcohol  and or any Recreational drug use.  If you experience worsening of your admission symptoms, develop shortness of breath, life threatening emergency, suicidal or homicidal thoughts you must seek medical attention immediately by calling 911 or calling your MD immediately  if symptoms less severe.  You Must read complete instructions/literature along with all the possible adverse reactions/side effects for all the Medicines you take and that have been prescribed to you. Take any new Medicines after you have completely understood and accpet all the possible adverse reactions/side effects.   Do not drive, operate heavy machinery, perform activities at heights, swimming or participation in water activities or provide baby sitting services if your were admitted for syncope or siezures until you have seen by Primary MD or a Neurologist and advised to do so again.  Do not drive when taking Pain medications.  Do not take more than prescribed Pain, Sleep and Anxiety Medications  Wear Seat belts while driving.   Please note You were cared for by a hospitalist during your hospital stay. If you have any questions about your discharge  medications or the care you received while you were in the hospital after you are discharged, you can call the unit and asked to speak with the hospitalist on call if the hospitalist that took care of you is not available. Once you are discharged, your primary care physician will handle any further medical issues. Please note that NO REFILLS for any discharge medications will be authorized once you are discharged, as it is imperative that you return to your primary care physician (or establish a relationship with a primary care physician if you do not have one) for your aftercare needs so that they can reassess your need for medications and monitor your lab values.    Allergies as of 11/11/2020      Reactions   Penicillins Hives      Medication List    STOP taking these medications   guaifenesin 100 MG/5ML syrup Commonly known as: ROBITUSSIN     TAKE these medications   acetaminophen 500 MG tablet Commonly known as: TYLENOL Take 1,000 mg by mouth every 6 (six) hours as needed for mild pain, fever or headache.   BC HEADACHE POWDER PO Take 1 Package by mouth as needed (headache/pain).   guaiFENesin-dextromethorphan 100-10 MG/5ML syrup Commonly known as: ROBITUSSIN DM Take 10 mLs by mouth every 4 (four) hours as needed for cough.   predniSONE 10 MG tablet Commonly known as: DELTASONE Take 4 tablets daily X 2 days, then, Take 3 tablets daily X 2 days, then, Take 2 tablets daily X 2 days, then, Take 1 tablets daily X 1 day.       Time coordinating discharge: 35 minutes  The results of significant diagnostics from this hospitalization (including imaging, microbiology, ancillary and laboratory) are listed below for reference.    Procedures and Diagnostic Studies:   CT ANGIO CHEST PE W OR WO CONTRAST  Result Date: 11/07/2020 CLINICAL DATA:  Short of breath, hypoxia, positive D-dimer, COVID-19 EXAM: CT ANGIOGRAPHY CHEST WITH CONTRAST TECHNIQUE: Multidetector CT imaging of the chest was  performed using the standard protocol during bolus administration of intravenous contrast. Multiplanar CT image  reconstructions and MIPs were obtained to evaluate the vascular anatomy. CONTRAST:  OMNIPAQUE IOHEXOL 350 MG/ML SOLN COMPARISON:  11/07/2020 FINDINGS: Cardiovascular: This is a technically adequate evaluation of the pulmonary vasculature. There are no filling defects or pulmonary emboli. The heart is enlarged without pericardial effusion. No evidence of thoracic aortic aneurysm or dissection. Mediastinum/Nodes: No enlarged mediastinal, hilar, or axillary lymph nodes. Thyroid gland, trachea, and esophagus demonstrate no significant findings. Lungs/Pleura: There is moderate multifocal bilateral ground-glass airspace disease consistent with COVID 19 pneumonia. No effusion or pneumothorax. The central airways are patent. Upper Abdomen: No acute abnormality. Musculoskeletal: No acute or destructive bony lesions. Reconstructed images demonstrate no additional findings. Review of the MIP images confirms the above findings. IMPRESSION: 1. No evidence of pulmonary embolus. 2. Multifocal bilateral pneumonia compatible with COVID 19. Electronically Signed   By: Sharlet Salina M.D.   On: 11/07/2020 16:42   DG Chest Port 1 View  Result Date: 11/07/2020 CLINICAL DATA:  Clip EXAM: PORTABLE CHEST 1 VIEW COMPARISON:  November 02, 2020 FINDINGS: The cardiomediastinal silhouette is unchanged in contour.Low lung volumes. No pleural effusion. No pneumothorax. Increased multifocal bilateral peripheral predominant heterogeneous opacities, consistent with the sequela of COVID-19 infection. Visualized abdomen is unremarkable. No acute osseous abnormality. IMPRESSION: Increased multifocal bilateral peripheral predominant heterogeneous opacities, consistent with the sequela of COVID-19 infection. Electronically Signed   By: Meda Klinefelter MD   On: 11/07/2020 11:37   US Abdomen Limited RUQ (LIVER/GB)  Result Date:  11/07/2020 CLINICAL DATA:  Elevated LFT EXAM: ULTRASOUND ABDOMEN LIMITED RIGHT UPPER QUADRANT COMPARISON:  None. FINDINGS: Gallbladder: No gallstones or wall thickening visualized. No sonographic Murphy sign noted by sonographer. Common bile duct: Diameter: 3.8 mm Liver: Diffusely echogenic liver without focal liver lesion. Portal vein is patent on color Doppler imaging with normal direction of blood flow towards the liver. Other: None. IMPRESSION: Fatty liver.  Negative for gallstones or biliary dilatation. Electronically Signed   By: Marlan Palau M.D.   On: 11/07/2020 16:26     Labs:   Basic Metabolic Panel: Recent Labs  Lab 11/07/20 1213 11/08/20 0448 11/09/20 0428 11/10/20 0327 11/11/20 0409  NA 132*  --   --   --  131*  K 4.4  --   --   --  4.5  CL 95*  --   --   --  97*  CO2 22  --   --   --  21*  GLUCOSE 123*  --   --   --  246*  BUN 17  --   --   --  20  CREATININE 1.18  --   --   --  0.96  CALCIUM 9.3  --   --   --  8.7*  MG  --  2.8* 2.8* 2.7* 2.5*  PHOS  --  4.1 4.5 3.5 3.7   GFR Estimated Creatinine Clearance: 168.4 mL/min (by C-G formula based on SCr of 0.96 mg/dL). Liver Function Tests: Recent Labs  Lab 11/07/20 1213 11/10/20 0327  AST 71* 38  ALT 57* 93*  ALKPHOS 36* 35*  BILITOT 1.2 0.5  PROT 9.2* 7.1  ALBUMIN 4.3 3.3*   No results for input(s): LIPASE, AMYLASE in the last 168 hours. No results for input(s): AMMONIA in the last 168 hours. Coagulation profile No results for input(s): INR, PROTIME in the last 168 hours.  CBC: Recent Labs  Lab 11/07/20 1245 11/08/20 0448 11/09/20 0428 11/10/20 0327 11/11/20 0409  WBC 7.4 5.9 9.7 10.7* 10.4  NEUTROABS 5.7 4.8 8.2* 9.2* 8.4*  HGB 14.9 13.8 13.4 13.6 15.2  HCT 45.7 42.8 41.9 42.5 47.2  MCV 86.4 87.5 87.3 87.3 87.9  PLT 360 377 420* 456* 487*   Cardiac Enzymes: No results for input(s): CKTOTAL, CKMB, CKMBINDEX, TROPONINI in the last 168 hours. BNP: Invalid input(s): POCBNP CBG: Recent Labs   Lab 11/10/20 0803 11/10/20 1136 11/10/20 1637 11/10/20 2043 11/11/20 0802  GLUCAP 145* 225* 152* 159* 259*   D-Dimer Recent Labs    11/10/20 0327 11/11/20 0409  DDIMER 2.67* 2.12*   Hgb A1c Recent Labs    11/09/20 0428  HGBA1C 6.2*   Lipid Profile No results for input(s): CHOL, HDL, LDLCALC, TRIG, CHOLHDL, LDLDIRECT in the last 72 hours. Thyroid function studies No results for input(s): TSH, T4TOTAL, T3FREE, THYROIDAB in the last 72 hours.  Invalid input(s): FREET3 Anemia work up Recent Labs    11/10/20 0327 11/11/20 0409  FERRITIN 517* 548*   Microbiology Recent Results (from the past 240 hour(s))  Blood Culture (routine x 2)     Status: None (Preliminary result)   Collection Time: 11/07/20 12:13 PM   Specimen: BLOOD  Result Value Ref Range Status   Specimen Description   Final    BLOOD RIGHT ANTECUBITAL Performed at [facility], [address], [place], [place] [address]    Special Requests   Final    BOTTLES DRAWN AEROBIC AND ANAEROBIC Blood Culture results may not be optimal due to an inadequate volume of blood received in culture bottles Performed at [facility], [address] [place], [place] [address]    Culture   Final    NO GROWTH 3 DAYS Performed at [facility], [address], [place], [place] [address]    Report Status PENDING  Incomplete  Blood Culture (routine x 2)     Status: None (Preliminary result)   Collection Time: 11/07/20 12:57 PM   Specimen: BLOOD  Result Value Ref Range Status   Specimen Description   Final    BLOOD RIGHT ANTECUBITAL Performed at [facility], [address], [place], [place] [address]    Special Requests   Final    BOTTLES DRAWN AEROBIC AND ANAEROBIC Blood Culture adequate volume Performed at [facility], [address], [place], [place] [address]    Culture   Final    NO GROWTH 3 DAYS Performed at [facility], [address], [place], [place] [address]    Report Status PENDING  Incomplete     Signed: Melina Schools [place] Krotz  [facility] 11/11/2020, 10:00 AM

## 2020-11-11 NOTE — TOC Transition Note (Signed)
Transition of Care [facility]) - CM/SW Discharge Note   Patient Details  Name: Kevin Brock MRN: [id number] Date of Birth: 05-25-1986  Transition of Care [facility] CM/SW Contact:  Ida Rogue, LCSW Phone Number: 11/11/2020, 10:41 AM   Clinical Narrative:   Patient who is stable for discharge today is in need of PCP.  Patient set up with [facility] for next Tuesday.  No further needs identified.  TOC sign off.    Final next level of care: Home/Self Care Barriers to Discharge: No Barriers Identified   Patient Goals and CMS Choice        Discharge Placement                       Discharge Plan and Services                                     Social Determinants of Health (SDOH) Interventions     Readmission Risk Interventions No flowsheet data found.

## 2020-11-12 LAB — CULTURE, BLOOD (ROUTINE X 2)
Culture: NO GROWTH
Culture: NO GROWTH
Special Requests: ADEQUATE

## 2020-11-18 ENCOUNTER — Telehealth: Payer: Self-pay | Admitting: Family Medicine

## 2020-11-18 ENCOUNTER — Encounter: Payer: Self-pay | Admitting: Family Medicine

## 2020-11-18 ENCOUNTER — Telehealth (INDEPENDENT_AMBULATORY_CARE_PROVIDER_SITE_OTHER): Payer: BC Managed Care – PPO | Admitting: Family Medicine

## 2020-11-18 DIAGNOSIS — U071 COVID-19: Secondary | ICD-10-CM | POA: Diagnosis not present

## 2020-11-18 DIAGNOSIS — R059 Cough, unspecified: Secondary | ICD-10-CM

## 2020-11-18 DIAGNOSIS — R0989 Other specified symptoms and signs involving the circulatory and respiratory systems: Secondary | ICD-10-CM

## 2020-11-18 DIAGNOSIS — Z09 Encounter for follow-up examination after completed treatment for conditions other than malignant neoplasm: Secondary | ICD-10-CM | POA: Diagnosis not present

## 2020-11-18 DIAGNOSIS — J1282 Pneumonia due to coronavirus disease 2019: Secondary | ICD-10-CM

## 2020-11-18 MED ORDER — HYDROCODONE-HOMATROPINE 5-1.5 MG/5ML PO SYRP: 5.0000 mL | ORAL_SOLUTION | Freq: Three times a day (TID) | ORAL | 0 refills | Status: AC | PRN

## 2020-11-18 MED ORDER — DOXYCYCLINE HYCLATE 100 MG PO TABS: 100.0000 mg | ORAL_TABLET | Freq: Two times a day (BID) | ORAL | 0 refills | Status: AC

## 2020-11-18 NOTE — Progress Notes (Signed)
Virtual Visit via Telephone Note  I connected with Kevin Brock on 11/18/20 at  3:00 PM EST by telephone and verified that I am speaking with the correct person using two identifiers.  Location: Patient: Home Provider: Office   I discussed the limitations, risks, security and privacy concerns of performing an evaluation and management service by telephone and the availability of in person appointments. I also discussed with the patient that there may be a patient responsible charge related to this service. The patient expressed understanding and agreed to proceed.   History of Present Illness:  Patient Active Problem List   Diagnosis Date Noted  . Obesity, Class III, BMI 40-49.9 (morbid obesity) (HCC) 11/11/2020  . Pneumonia due to COVID-19 virus 11/07/2020   Current Status: This will be his initial office visit with me. He was previously seeing a Physician for his PCP needs. He has recently relocated to [facility] from [facility], [place]. He has been in [place], [place] since 04/2020. Since his last office visit, he has been diagnosed with Coronavirus/Pneumonia on 10/30/2020 and eventually admitted to Hospital from 11/07/2020-11/11/2020 for Acute Respiratory Failure. Today, he continues to have increased incidents of shortness, cough, and increased fatigue. He denies fevers, chills, recent infections, weight loss, and night sweats. He has not had any headaches, visual changes, dizziness, and falls. No chest pain and heart palpitations reported. No reports of GI problems such as nausea, vomiting, diarrhea, and constipation. He has no reports of blood in stools, dysuria and hematuria. No depression or anxiety reported today. He is taking all medications as prescribed. He denies pain today.    Observations/Objective:  Telephone Virtual Visit  Assessment and Plan:  1. Cough - doxycycline (VIBRA-TABS) 100 MG tablet; Take 1 tablet (100 mg total) by mouth 2 (two) times daily.  Dispense: 20 tablet;  Refill: 0 - HYDROcodone-homatropine (HYCODAN) 5-1.5 MG/5ML syrup; Take 5 mLs by mouth every 8 (eight) hours as needed for cough.  Dispense: 120 mL; Refill: 0  2. Pneumonia due to COVID-19 virus Stable. Discussed symptomatic treatment for Covid infection. Discussed red flag symptoms and when to seek medical attention at urgent care or the emergency department.  - doxycycline (VIBRA-TABS) 100 MG tablet; Take 1 tablet (100 mg total) by mouth 2 (two) times daily.  Dispense: 20 tablet; Refill: 0 - HYDROcodone-homatropine (HYCODAN) 5-1.5 MG/5ML syrup; Take 5 mLs by mouth every 8 (eight) hours as needed for cough.  Dispense: 120 mL; Refill: 0  3. Chest congestion Minimal. No signs or symptoms of respiratory distress noted or reported. Monitor.   4. Follow up He will follow up in 1 month---[id number] He also needs follow up appointment with [facility] asap for shortness of breath.   Meds ordered this encounter  Medications  . doxycycline (VIBRA-TABS) 100 MG tablet    Sig: Take 1 tablet (100 mg total) by mouth 2 (two) times daily.    Dispense:  20 tablet    Refill:  0  . HYDROcodone-homatropine (HYCODAN) 5-1.5 MG/5ML syrup    Sig: Take 5 mLs by mouth every 8 (eight) hours as needed for cough.    Dispense:  120 mL    Refill:  0    Order Specific Question:   Supervising Provider    Answer:   Quentin Angst [id number]    No orders of the defined types were placed in this encounter.   refills  Raliegh Ip, MSN, ANE, FNP-BC [facility]  [address] [place], [place] [address] [phone]     I discussed the assessment and treatment plan with the patient. The patient was provided an opportunity to ask questions and all were answered. The patient agreed with the plan and demonstrated an understanding of the instructions.   The patient was advised to call back or seek an  in-person evaluation if the symptoms worsen or if the condition fails to improve as anticipated.  I provided 20 minutes of non-face-to-face time during this encounter.   Kallie Locks, FNP

## 2020-11-18 NOTE — Telephone Encounter (Signed)
[facility] says they need insurance approval for meds sent in today.

## 2020-11-22 ENCOUNTER — Encounter: Payer: Self-pay | Admitting: Family Medicine

## 2020-11-25 ENCOUNTER — Other Ambulatory Visit: Payer: Self-pay

## 2020-11-25 ENCOUNTER — Ambulatory Visit (INDEPENDENT_AMBULATORY_CARE_PROVIDER_SITE_OTHER): Payer: BC Managed Care – PPO | Admitting: Nurse Practitioner

## 2020-11-25 VITALS — BP 130/89 | HR 101 | Temp 96.8°F

## 2020-11-25 DIAGNOSIS — R748 Abnormal levels of other serum enzymes: Secondary | ICD-10-CM

## 2020-11-25 DIAGNOSIS — J1282 Pneumonia due to coronavirus disease 2019: Secondary | ICD-10-CM

## 2020-11-25 DIAGNOSIS — U071 COVID-19: Secondary | ICD-10-CM

## 2020-11-25 NOTE — Assessment & Plan Note (Signed)
Cough:   Stay well hydrated  Stay active  Deep breathing exercises  May start vitamin C daily, vitamin D3 daily, Zinc daily  May take tylenol or fever or pain  May take mucinex DM twice daily  Will order chest x ray: 12/12/20  [facility] [address] [place], [place] [address] [phone] MON - FRI 8:00 AM - 4:00 PM - WALK IN  [facility] - 12/12/20   Follow up:  Follow up in 2 weeks or sooner if needed

## 2020-11-25 NOTE — Patient Instructions (Addendum)
Covid pneumonia Cough:   Stay well hydrated  Stay active  Deep breathing exercises  May start vitamin C daily, vitamin D3 daily, Zinc daily  May take tylenol or fever or pain  May take mucinex DM twice daily  Will order chest x ray: 12/12/20  [facility] [address] [place], [place] [address] [phone] MON - FRI 8:00 AM - 4:00 PM - WALK IN  Lab work at [facility] - 12/12/20   Follow up:  Follow up in 2 weeks or sooner if needed

## 2020-11-25 NOTE — Progress Notes (Signed)
@Patient  ID: , male    DOB: [birth year], 35 y.o.   MRN: [id number]  Chief Complaint  Patient presents with  . Hospitalization Follow-up    Slowly improving, returning to work today.     Referring provider: [id number], FNP   HPI  Patient presents today for post COVID care clinic visit/hospital follow-up.  Patient was noted to the hospital on 11/07/2020 through 11/11/2020.  He was admitted for COVID pneumonia and acute hypoxic respiratory failure.  He was also found to have elevated liver enzymes.  Patient was treated with IV remdesivir, IV steroids, oxygen.  Patient was discharged home on room air.  Patient states that he has been doing well since hospital discharge.  He is improving slowly.  He does plan to return to work today.  Patient is trying to stay active and eating well and staying Well-hydrated.  He has establish care with a primary care physician.  He had a virtual visit with 01/09/2021 on 11/18/2020.  He was prescribed doxycycline and Hycodan.  He states that he never picked up the medications. Denies f/c/s, n/v/d, hemoptysis, PND, chest pain or edema.      Allergies  Allergen Reactions  . Penicillins Hives     There is no immunization history on file for this patient.  Past Medical History:  Diagnosis Date  . Coronavirus infection 10/30/2020    Tobacco History: Social History   Tobacco Use  Smoking Status Never Smoker  Smokeless Tobacco Never Used   Counseling given: Yes   Outpatient Encounter Medications as of 11/25/2020  Medication Sig  . acetaminophen (TYLENOL) 500 MG tablet Take 1,000 mg by mouth every 6 (six) hours as needed for mild pain, fever or headache.  . Aspirin-Salicylamide-Caffeine (BC HEADACHE POWDER PO) Take 1 Package by mouth as needed (headache/pain).  11/27/2020 doxycycline (VIBRA-TABS) 100 MG tablet Take 1 tablet (100 mg total) by mouth 2 (two) times daily.  Marland Kitchen guaiFENesin-dextromethorphan (ROBITUSSIN DM) 100-10 MG/5ML syrup  Take 10 mLs by mouth every 4 (four) hours as needed for cough.  Marland Kitchen HYDROcodone-homatropine (HYCODAN) 5-1.5 MG/5ML syrup Take 5 mLs by mouth every 8 (eight) hours as needed for cough.  . predniSONE (DELTASONE) 10 MG tablet Take 4 tablets daily X 2 days, then, Take 3 tablets daily X 2 days, then, Take 2 tablets daily X 2 days, then, Take 1 tablets daily X 1 day.   No facility-administered encounter medications on file as of 11/25/2020.     Review of Systems  Review of Systems  Constitutional: Negative.  Negative for fatigue and fever.  HENT: Negative.   Respiratory: Positive for cough. Negative for shortness of breath.   Cardiovascular: Negative.  Negative for chest pain, palpitations and leg swelling.  Gastrointestinal: Negative.   Allergic/Immunologic: Negative.   Neurological: Negative.   Psychiatric/Behavioral: Negative.        Physical Exam  BP 130/89   Pulse (!) 101   Temp (!) 96.8 F (36 C)   SpO2 99% Comment: RA  Wt Readings from Last 5 Encounters:  11/07/20 (!) 320 lb (145.2 kg)     Physical Exam Vitals and nursing note reviewed.  Constitutional:      General: He is not in acute distress.    Appearance: He is well-developed and well-nourished.  Cardiovascular:     Rate and Rhythm: Normal rate and regular rhythm.  Pulmonary:     Effort: Pulmonary effort is normal.     Breath sounds: Normal breath sounds.  Skin:  General: Skin is warm and dry.  Neurological:     Mental Status: He is alert and oriented to person, place, and time.  Psychiatric:        Mood and Affect: Mood and affect normal.       Imaging: CT ANGIO CHEST PE W OR WO CONTRAST  Result Date: 11/07/2020 CLINICAL DATA:  Short of breath, hypoxia, positive D-dimer, COVID-19 EXAM: CT ANGIOGRAPHY CHEST WITH CONTRAST TECHNIQUE: Multidetector CT imaging of the chest was performed using the standard protocol during bolus administration of intravenous contrast. Multiplanar CT image reconstructions and  MIPs were obtained to evaluate the vascular anatomy. CONTRAST:  OMNIPAQUE IOHEXOL 350 MG/ML SOLN COMPARISON:  11/07/2020 FINDINGS: Cardiovascular: This is a technically adequate evaluation of the pulmonary vasculature. There are no filling defects or pulmonary emboli. The heart is enlarged without pericardial effusion. No evidence of thoracic aortic aneurysm or dissection. Mediastinum/Nodes: No enlarged mediastinal, hilar, or axillary lymph nodes. Thyroid gland, trachea, and esophagus demonstrate no significant findings. Lungs/Pleura: There is moderate multifocal bilateral ground-glass airspace disease consistent with COVID 19 pneumonia. No effusion or pneumothorax. The central airways are patent. Upper Abdomen: No acute abnormality. Musculoskeletal: No acute or destructive bony lesions. Reconstructed images demonstrate no additional findings. Review of the MIP images confirms the above findings. IMPRESSION: 1. No evidence of pulmonary embolus. 2. Multifocal bilateral pneumonia compatible with COVID 19. Electronically Signed   By: Sharlet Salina M.D.   On: 11/07/2020 16:42   DG Chest Port 1 View  Result Date: 11/07/2020 CLINICAL DATA:  Clip EXAM: PORTABLE CHEST 1 VIEW COMPARISON:  November 02, 2020 FINDINGS: The cardiomediastinal silhouette is unchanged in contour.Low lung volumes. No pleural effusion. No pneumothorax. Increased multifocal bilateral peripheral predominant heterogeneous opacities, consistent with the sequela of COVID-19 infection. Visualized abdomen is unremarkable. No acute osseous abnormality. IMPRESSION: Increased multifocal bilateral peripheral predominant heterogeneous opacities, consistent with the sequela of COVID-19 infection. Electronically Signed   By: Meda Klinefelter MD   On: 11/07/2020 11:37   DG Chest Portable 1 View  Result Date: 11/02/2020 CLINICAL DATA:  Fever, chills, cough, generalized weakness and shortness of breath. COVID positive. EXAM: PORTABLE CHEST 1 VIEW  COMPARISON:  None. FINDINGS: Study is hypoinspiratory. Given the low lung volumes, lungs appear clear and heart size is likely upper normal. No pleural effusion or pneumothorax is seen. IMPRESSION: Low lung volumes. No evidence of pneumonia. If symptoms persist or worsen, consider repeat chest x-ray with better inspiratory effort. Electronically Signed   By: Bary Richard M.D.   On: 11/02/2020 15:57   US Abdomen Limited RUQ (LIVER/GB)  Result Date: 11/07/2020 CLINICAL DATA:  Elevated LFT EXAM: ULTRASOUND ABDOMEN LIMITED RIGHT UPPER QUADRANT COMPARISON:  None. FINDINGS: Gallbladder: No gallstones or wall thickening visualized. No sonographic Murphy sign noted by sonographer. Common bile duct: Diameter: 3.8 mm Liver: Diffusely echogenic liver without focal liver lesion. Portal vein is patent on color Doppler imaging with normal direction of blood flow towards the liver. Other: None. IMPRESSION: Fatty liver.  Negative for gallstones or biliary dilatation. Electronically Signed   By: Marlan Palau M.D.   On: 11/07/2020 16:26     Assessment & Plan:   Pneumonia due to COVID-19 virus Cough:   Stay well hydrated  Stay active  Deep breathing exercises  May start vitamin C daily, vitamin D3 daily, Zinc daily  May take tylenol or fever or pain  May take mucinex DM twice daily  Will order chest x ray: 12/12/20  [facility] [address] [place], [place] [address] [phone] MON - FRI 8:00 AM - 4:00 PM - WALK IN  Lab work at [facility] - 12/12/20   Follow up:  Follow up in 2 weeks or sooner if needed      Ivonne Andrew, NP 11/25/2020

## 2020-12-09 DIAGNOSIS — N5201 Erectile dysfunction due to arterial insufficiency: Secondary | ICD-10-CM | POA: Diagnosis not present

## 2020-12-22 ENCOUNTER — Ambulatory Visit: Payer: BC Managed Care – PPO | Admitting: Family Medicine

## 2022-01-04 IMAGING — DX DG CHEST 1V PORT
1 series · 1 of 1 positions shown · non-contrast
Comparison: None.

CLINICAL DATA: Fever, chills, cough, generalized weakness and
shortness of breath. COVID positive.

EXAM:
PORTABLE CHEST 1 VIEW

[chest]
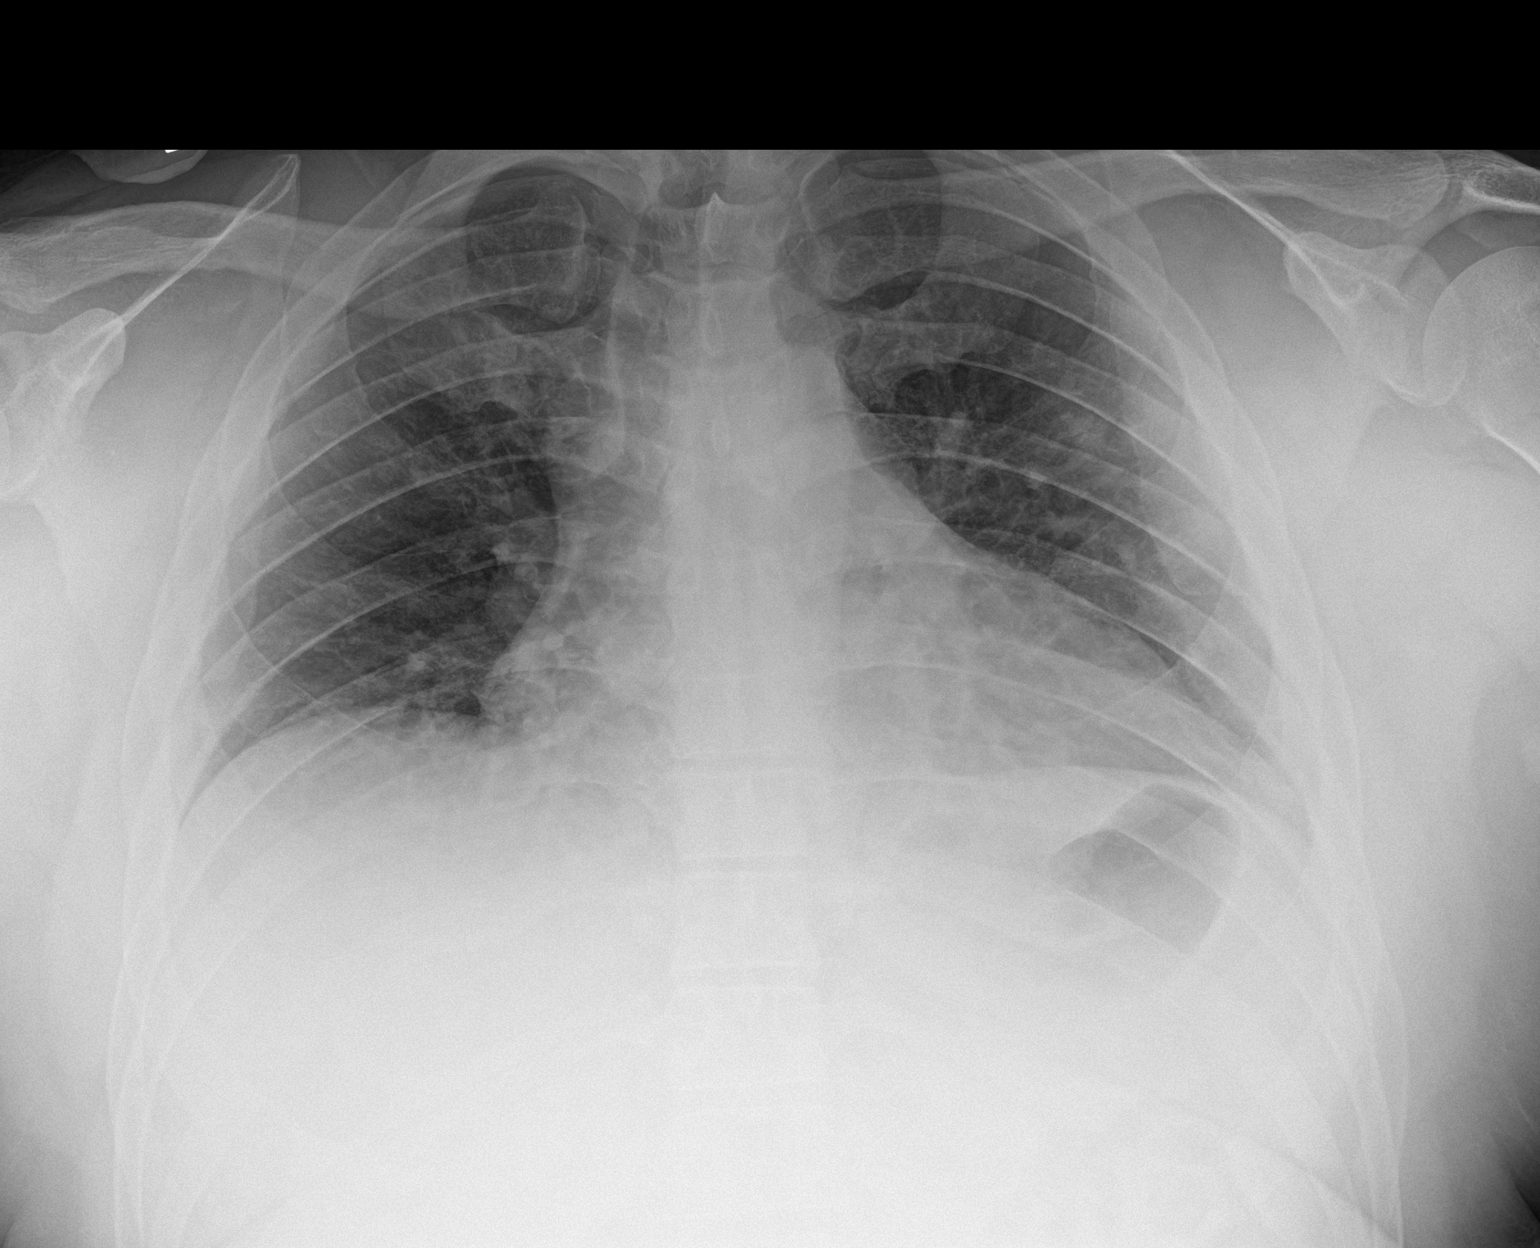

[1 of 1 positions shown; findings below may reference images not displayed]

FINDINGS: Study is hypoinspiratory. Given the low lung volumes, lungs appear
clear and heart size is likely upper normal. No pleural effusion or
pneumothorax is seen.
IMPRESSION: Low lung volumes. No evidence of pneumonia. If symptoms persist or
worsen, consider repeat chest x-ray with better inspiratory effort.

## 2022-01-09 IMAGING — CR DG CHEST 1V PORT
1 series · 1 of 1 positions shown · non-contrast
Comparison: November 02, 2020

CLINICAL DATA: Clip

EXAM:
PORTABLE CHEST 1 VIEW

[w chest pa]
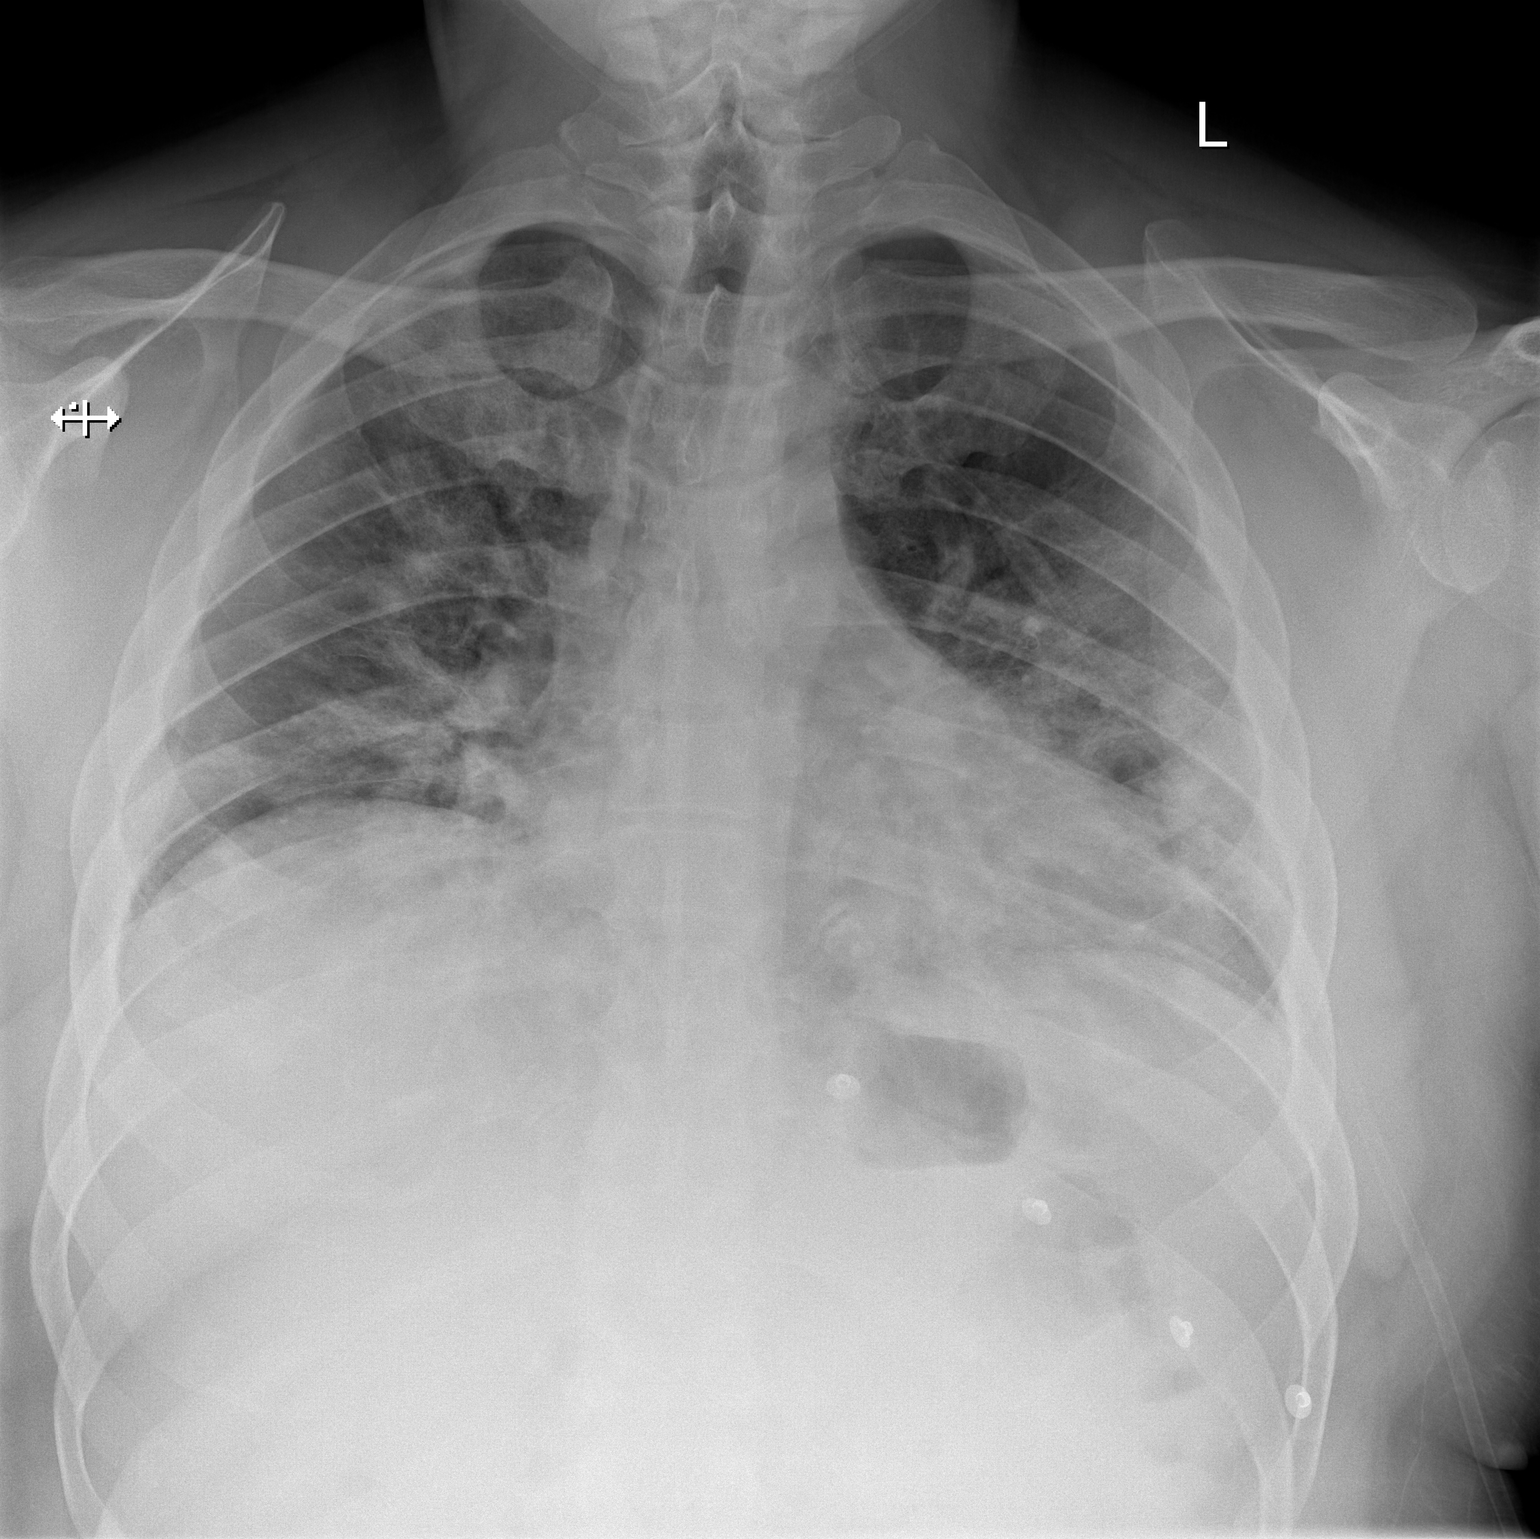

[1 of 1 positions shown; findings below may reference images not displayed]

FINDINGS: The cardiomediastinal silhouette is unchanged in contour.Low lung
volumes. No pleural effusion. No pneumothorax. Increased multifocal
bilateral peripheral predominant heterogeneous opacities, consistent
with the sequela of H1ZF3-6F infection. Visualized abdomen is
unremarkable. No acute osseous abnormality.
IMPRESSION: Increased multifocal bilateral peripheral predominant heterogeneous
opacities, consistent with the sequela of H1ZF3-6F infection.

## 2022-01-09 IMAGING — US US ABDOMEN LIMITED
1 series · 14 of 25 positions shown · non-contrast
Comparison: None.

CLINICAL DATA: Elevated LFT

EXAM:
ULTRASOUND ABDOMEN LIMITED RIGHT UPPER QUADRANT

[Series 1: us abdomen limited · 14 of 44 slices shown]
[im 1/44]
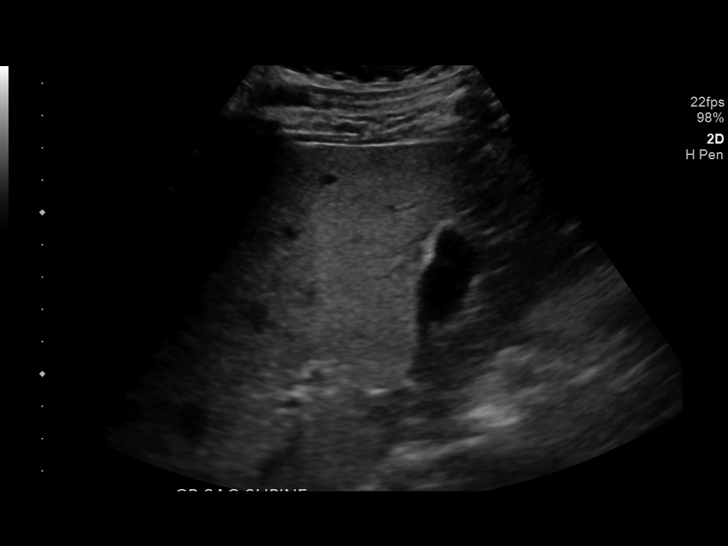
[im 4/44]
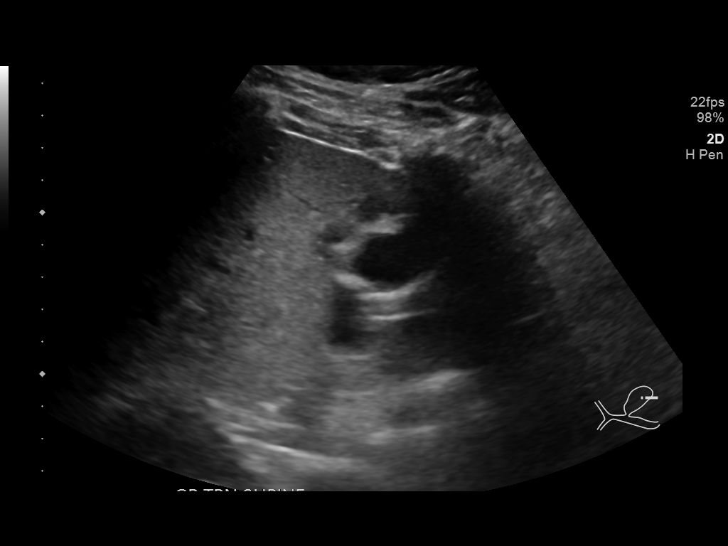
[im 8/44]
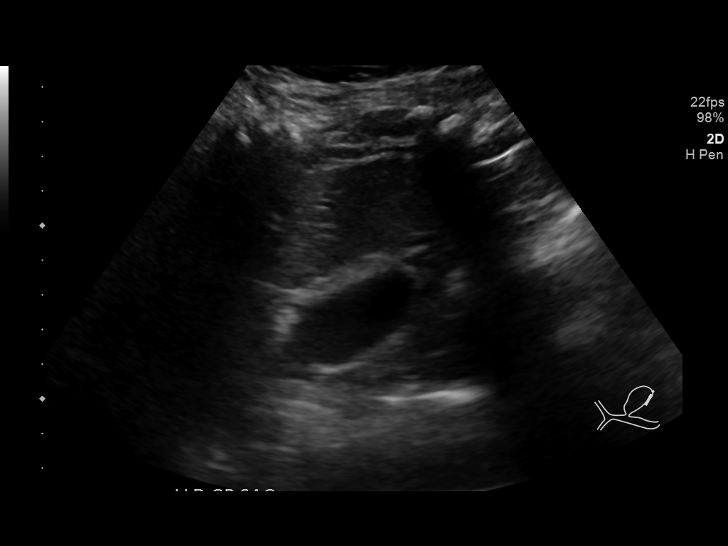
[im 11/44]
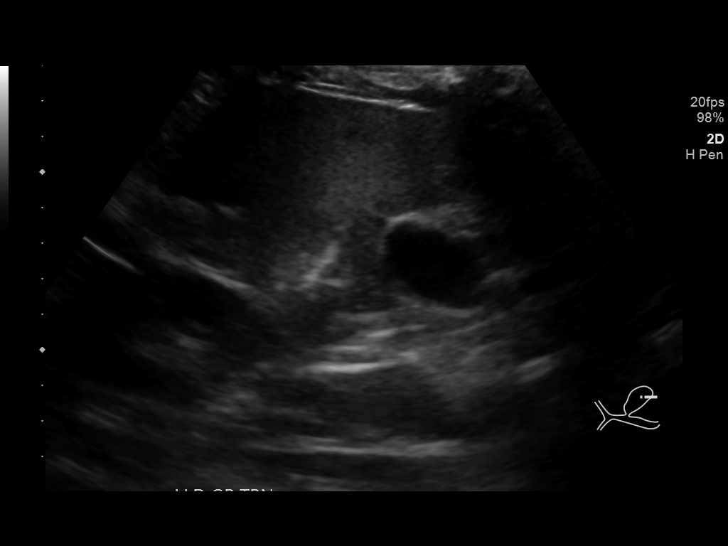
[im 15/44]
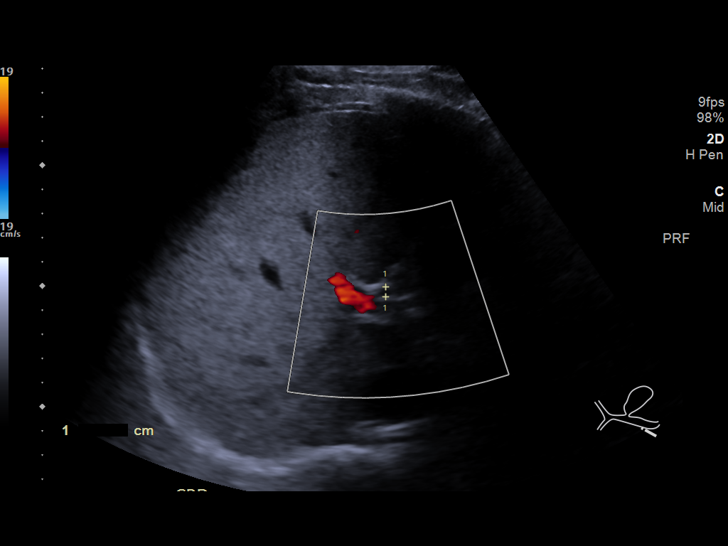
[im 17/44]
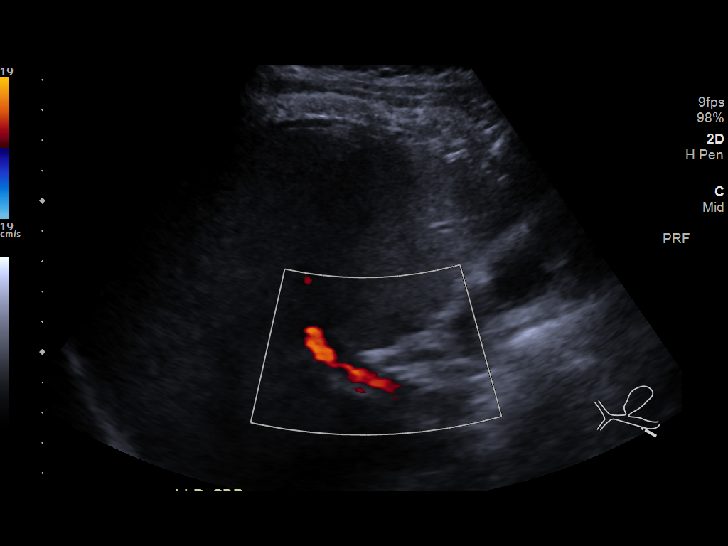
[im 20/44]
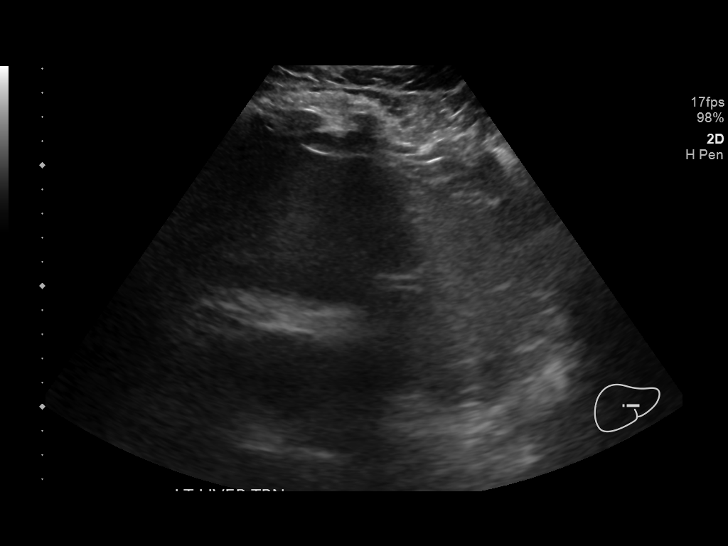
[im 24/44]
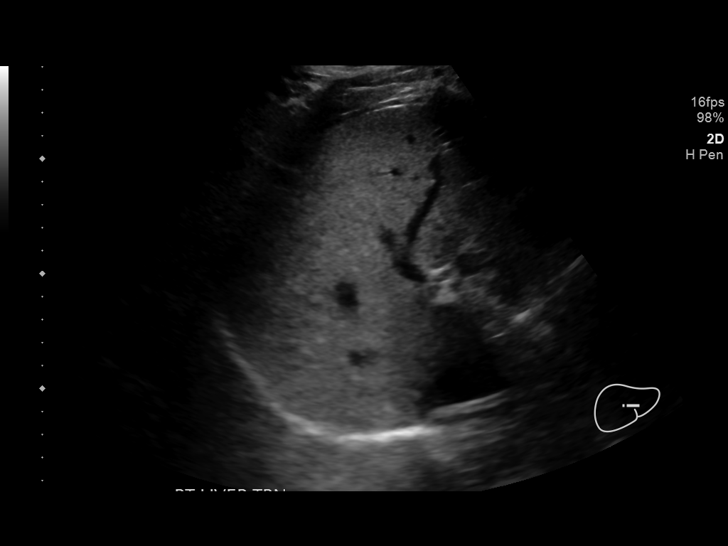
[im 27/44]
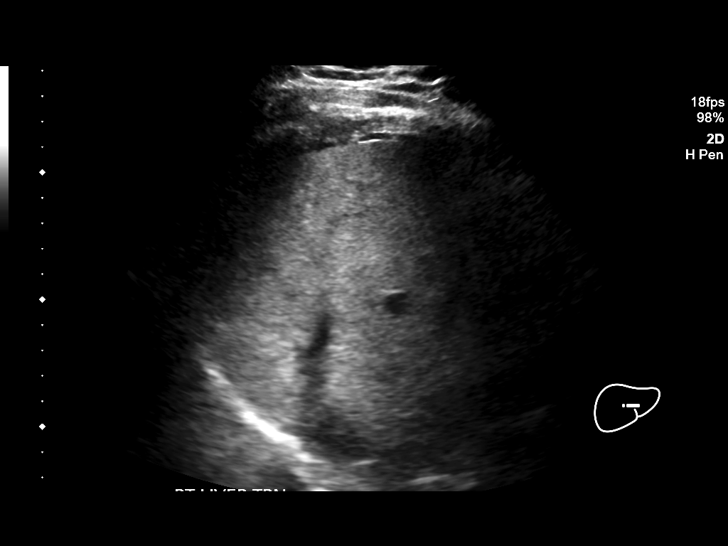
[im 29/44]
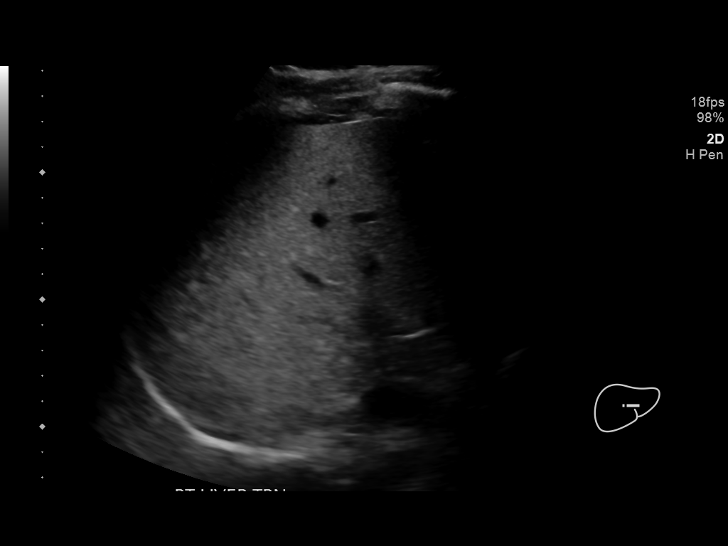
[im 33/44]
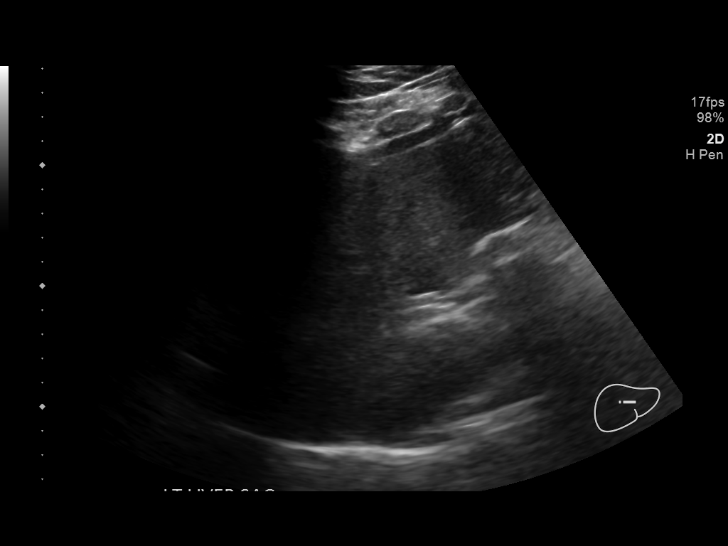
[im 36/44]
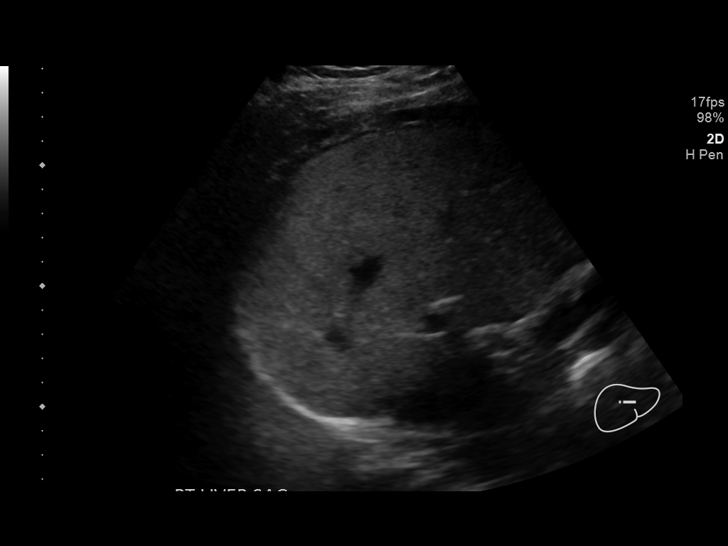
[im 40/44]
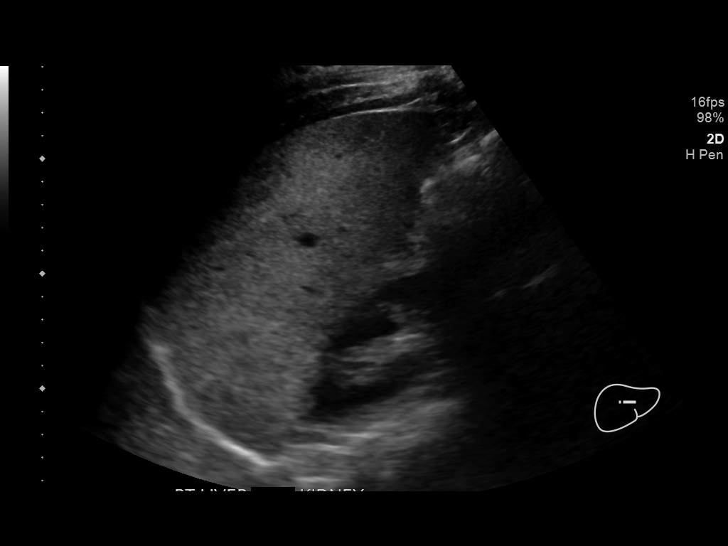
[im 44/44]
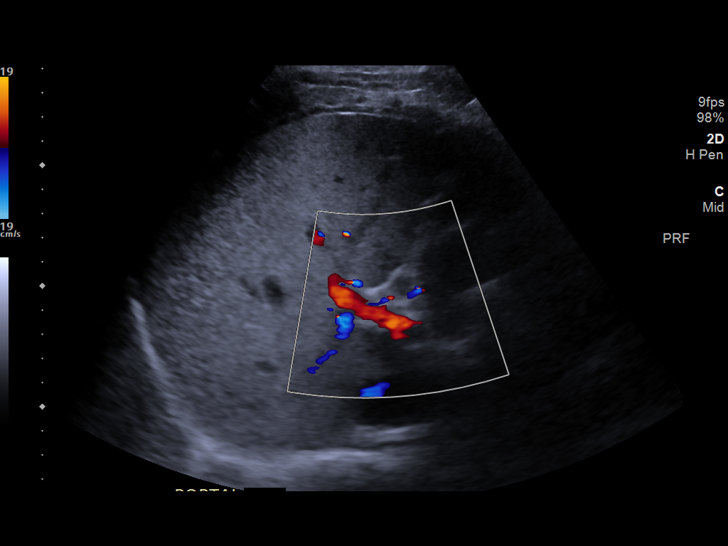

[14 of 25 positions shown; findings below may reference images not displayed]

FINDINGS: Gallbladder:

No gallstones or wall thickening visualized. No sonographic Murphy
sign noted by sonographer.

Common bile duct:

Diameter: 3.8 mm

Liver:

Diffusely echogenic liver without focal liver lesion. Portal vein is
patent on color Doppler imaging with normal direction of blood flow
towards the liver.

Other: None.
IMPRESSION: Fatty liver.  Negative for gallstones or biliary dilatation.

## 2022-02-03 ENCOUNTER — Encounter: Payer: Self-pay | Admitting: Nurse Practitioner

## 2022-02-03 ENCOUNTER — Ambulatory Visit (INDEPENDENT_AMBULATORY_CARE_PROVIDER_SITE_OTHER): Payer: BC Managed Care – PPO | Admitting: Nurse Practitioner

## 2022-02-03 VITALS — BP 152/96 | HR 91 | Temp 97.2°F | Ht 75.0 in | Wt 364.2 lb

## 2022-02-03 DIAGNOSIS — Z7689 Persons encountering health services in other specified circumstances: Secondary | ICD-10-CM | POA: Diagnosis not present

## 2022-02-03 DIAGNOSIS — Z6841 Body Mass Index (BMI) 40.0 and over, adult: Secondary | ICD-10-CM | POA: Diagnosis not present

## 2022-02-03 NOTE — Progress Notes (Signed)
@Patient  ID: , male    DOB: 1986-09-27, 36 y.o.   MRN: [id number] ? ?Chief Complaint  ?Patient presents with  ? Follow-up  ?  Patient is here today to re-establish care and to get  medical clearance for weight loss surgery.  ? ? ?Referring provider: ?[id number], FNP ? ? ?HPI ? ?Patient presents today to establish care.  Patient states that he is not currently on any medications.  Patient states that he has no health history other than obesity.  Patient has been followed by bariatric specialist with [facility].  He is scheduled for gastric sleeve surgery.  He does need a surgical clearance completed today. Denies f/c/s, n/v/d, hemoptysis, PND, chest pain or edema. ? ? ? ?Not on any medications - no significant health history ? ?New issues or concerns ? ? ? ? ? ?Allergies  ?Allergen Reactions  ? Penicillins Hives  ? ? ? ?There is no immunization history on file for this patient. ? ?Past Medical History:  ?Diagnosis Date  ? Coronavirus infection 10/30/2020  ? ? ?Tobacco History: ?Social History  ? ?Tobacco Use  ?Smoking Status Never  ?Smokeless Tobacco Never  ? ?Counseling given: Not Answered ? ? ?Outpatient Encounter Medications as of 02/03/2022  ?Medication Sig  ? acetaminophen (TYLENOL) 500 MG tablet Take 1,000 mg by mouth every 6 (six) hours as needed for mild pain, fever or headache. (Patient not taking: Reported on 02/03/2022)  ? Aspirin-Salicylamide-Caffeine (BC HEADACHE POWDER PO) Take 1 Package by mouth as needed (headache/pain). (Patient not taking: Reported on 02/03/2022)  ? doxycycline (VIBRA-TABS) 100 MG tablet Take 1 tablet (100 mg total) by mouth 2 (two) times daily. (Patient not taking: Reported on 02/03/2022)  ? guaiFENesin-dextromethorphan (ROBITUSSIN DM) 100-10 MG/5ML syrup Take 10 mLs by mouth every 4 (four) hours as needed for cough. (Patient not taking: Reported on 02/03/2022)  ? HYDROcodone-homatropine (HYCODAN) 5-1.5 MG/5ML syrup Take 5 mLs by mouth every 8 (eight) hours as needed  for cough. (Patient not taking: Reported on 02/03/2022)  ? predniSONE (DELTASONE) 10 MG tablet Take 4 tablets daily X 2 days, then, Take 3 tablets daily X 2 days, then, Take 2 tablets daily X 2 days, then, Take 1 tablets daily X 1 day. (Patient not taking: Reported on 02/03/2022)  ? ?No facility-administered encounter medications on file as of 02/03/2022.  ? ? ? ?Review of Systems ? ?Review of Systems  ?Constitutional: Negative.   ?HENT: Negative.    ?Cardiovascular: Negative.   ?Gastrointestinal: Negative.   ?Allergic/Immunologic: Negative.   ?Neurological: Negative.   ?Psychiatric/Behavioral: Negative.     ? ? ? ?Physical Exam ? ?BP (!) 152/96 (Cuff Size: Large)   Pulse 91   Temp (!) 97.2 ?F (36.2 ?C)   Ht 6\' 3"  (1.905 m)   Wt (!) 364 lb 3.2 oz (165.2 kg)   SpO2 100%   BMI 45.52 kg/m?  ? ?Wt Readings from Last 5 Encounters:  ?02/03/22 (!) 364 lb 3.2 oz (165.2 kg)  ?11/07/20 (!) 320 lb (145.2 kg)  ? ? ? ?Physical Exam ?Vitals and nursing note reviewed.  ?Constitutional:   ?   General: He is not in acute distress. ?   Appearance: He is well-developed.  ?Cardiovascular:  ?   Rate and Rhythm: Normal rate and regular rhythm.  ?Pulmonary:  ?   Effort: Pulmonary effort is normal.  ?   Breath sounds: Normal breath sounds.  ?Skin: ?   General: Skin is warm and dry.  ?Neurological:  ?  Mental Status: He is alert and oriented to person, place, and time.  ? ? ? ?Lab Results: ? ?CBC ?   ?Component Value Date/Time  ? WBC 10.4 11/11/2020 0409  ? RBC 5.37 11/11/2020 0409  ? HGB 15.2 11/11/2020 0409  ? HCT 47.2 11/11/2020 0409  ? PLT 487 (H) 11/11/2020 0409  ? MCV 87.9 11/11/2020 0409  ? MCH 28.3 11/11/2020 0409  ? MCHC 32.2 11/11/2020 0409  ? RDW 13.2 11/11/2020 0409  ? LYMPHSABS 1.1 11/11/2020 0409  ? MONOABS 0.6 11/11/2020 0409  ? EOSABS 0.0 11/11/2020 0409  ? BASOSABS 0.0 11/11/2020 0409  ? ? ?BMET ?   ?Component Value Date/Time  ? NA 131 (L) 11/11/2020 0409  ? K 4.5 11/11/2020 0409  ? CL 97 (L) 11/11/2020 0409  ? CO2 21 (L)  11/11/2020 0409  ? GLUCOSE 246 (H) 11/11/2020 0409  ? BUN 20 11/11/2020 0409  ? CREATININE 0.96 11/11/2020 0409  ? CALCIUM 8.7 (L) 11/11/2020 0409  ? GFRNONAA >60 11/11/2020 0409  ? ? ?BNP ?No results found for: BNP ? ?ProBNP ?No results found for: PROBNP ? ?Imaging: ?No results found. ? ? ?Assessment & Plan:  ? ?Class 3 severe obesity due to excess calories without serious comorbidity with body mass index (BMI) of 45.0 to 49.9 in adult [facility]) ?-surgical clearance form for gastric sleeve through [facility] completed in office today. ? ? ?2. Encounter to establish care ? ?Follow up: ? ?Follow up in 3 months for physical ? ? ? ? ?Ivonne Andrew, NP ?02/05/2022 ? ?

## 2022-02-03 NOTE — Patient Instructions (Addendum)
1. Class 3 severe obesity due to excess calories without serious comorbidity with body mass index (BMI) of 45.0 to 49.9 in adult [facility]) ? ?-surgical clearance form for gastric sleeve through [facility] completed in office today. ? ? ?2. Encounter to establish care ? ?Follow up: ? ?Follow up in 3 months for physical ?

## 2022-02-05 ENCOUNTER — Encounter: Payer: Self-pay | Admitting: Nurse Practitioner

## 2022-02-05 NOTE — Assessment & Plan Note (Signed)
-  surgical clearance form for gastric sleeve through [facility] completed in office today. ? ? ?2. Encounter to establish care ? ?Follow up: ? ?Follow up in 3 months for physical ?

## 2022-05-06 ENCOUNTER — Ambulatory Visit: Payer: BC Managed Care – PPO | Admitting: Nurse Practitioner
